# Patient Record
Sex: Male | Born: 1960 | Race: White | Hispanic: No | Marital: Married | Smoking: Former smoker
Health system: Southern US, Community
[De-identification: ages and names within clinical notes are randomized; demographics above are authoritative.]

## PROBLEM LIST (undated history)

## (undated) DIAGNOSIS — E782 Mixed hyperlipidemia: Secondary | ICD-10-CM

## (undated) DIAGNOSIS — K2270 Barrett's esophagus without dysplasia: Secondary | ICD-10-CM

## (undated) DIAGNOSIS — J449 Chronic obstructive pulmonary disease, unspecified: Secondary | ICD-10-CM

## (undated) DIAGNOSIS — M5010 Cervical disc disorder with radiculopathy, unspecified cervical region: Secondary | ICD-10-CM

## (undated) HISTORY — DX: Cervical disc disorder with radiculopathy, unspecified cervical region: M50.10

## (undated) HISTORY — DX: Mixed hyperlipidemia: E78.2

## (undated) HISTORY — DX: Barrett's esophagus without dysplasia: K22.70

## (undated) HISTORY — DX: Chronic obstructive pulmonary disease, unspecified: J44.9

## (undated) HISTORY — PX: BACK SURGERY: SHX140

## (undated) HISTORY — PX: TONSILLECTOMY: SUR1361

---

## 2014-12-14 ENCOUNTER — Emergency Department: Payer: BLUE CROSS/BLUE SHIELD

## 2014-12-14 ENCOUNTER — Encounter: Payer: Self-pay | Admitting: Emergency Medicine

## 2014-12-14 ENCOUNTER — Emergency Department
Admission: EM | Admit: 2014-12-14 | Discharge: 2014-12-14 | Disposition: A | Discharge status: Home / Self Care | Payer: BLUE CROSS/BLUE SHIELD | Attending: Emergency Medicine | Admitting: Emergency Medicine

## 2014-12-14 DIAGNOSIS — R109 Unspecified abdominal pain: Secondary | ICD-10-CM

## 2014-12-14 DIAGNOSIS — N200 Calculus of kidney: Secondary | ICD-10-CM | POA: Insufficient documentation

## 2014-12-14 DIAGNOSIS — Z880 Allergy status to penicillin: Secondary | ICD-10-CM | POA: Insufficient documentation

## 2014-12-14 DIAGNOSIS — R079 Chest pain, unspecified: Secondary | ICD-10-CM | POA: Diagnosis not present

## 2014-12-14 DIAGNOSIS — Z87891 Personal history of nicotine dependence: Secondary | ICD-10-CM | POA: Diagnosis not present

## 2014-12-14 HISTORY — DX: Chronic obstructive pulmonary disease, unspecified: J44.9

## 2014-12-14 LAB — CBC
HEMATOCRIT: 48.1 % (ref 40.0–52.0)
Hemoglobin: 15.8 g/dL (ref 13.0–18.0)
MCH: 30.4 pg (ref 26.0–34.0)
MCHC: 32.9 g/dL (ref 32.0–36.0)
MCV: 92.6 fL (ref 80.0–100.0)
Platelets: 340 10*3/uL (ref 150–440)
RBC: 5.19 MIL/uL (ref 4.40–5.90)
RDW: 14 % (ref 11.5–14.5)
WBC: 12.5 10*3/uL — ABNORMAL HIGH (ref 3.8–10.6)

## 2014-12-14 LAB — HEPATIC FUNCTION PANEL
ALT: 28 U/L (ref 17–63)
AST: 26 U/L (ref 15–41)
Albumin: 4.5 g/dL (ref 3.5–5.0)
Alkaline Phosphatase: 73 U/L (ref 38–126)
BILIRUBIN DIRECT: 0.2 mg/dL (ref 0.1–0.5)
BILIRUBIN INDIRECT: 0.3 mg/dL (ref 0.3–0.9)
Total Bilirubin: 0.5 mg/dL (ref 0.3–1.2)
Total Protein: 7.4 g/dL (ref 6.5–8.1)

## 2014-12-14 LAB — BASIC METABOLIC PANEL
Anion gap: 10 (ref 5–15)
BUN: 17 mg/dL (ref 6–20)
CALCIUM: 9.5 mg/dL (ref 8.9–10.3)
CO2: 27 mmol/L (ref 22–32)
Chloride: 103 mmol/L (ref 101–111)
Creatinine, Ser: 0.88 mg/dL (ref 0.61–1.24)
GFR calc Af Amer: >60 mL/min (ref >60)
GLUCOSE: 121 mg/dL — AB (ref 65–99)
Potassium: 3.7 mmol/L (ref 3.5–5.1)
Sodium: 140 mmol/L (ref 135–145)

## 2014-12-14 LAB — URINALYSIS COMPLETE WITH MICROSCOPIC (ARMC ONLY)
BILIRUBIN URINE: NEGATIVE
Bacteria, UA: NONE SEEN
GLUCOSE, UA: NEGATIVE mg/dL
Ketones, ur: NEGATIVE mg/dL
LEUKOCYTES UA: NEGATIVE
Nitrite: NEGATIVE
Protein, ur: NEGATIVE mg/dL
SQUAMOUS EPITHELIAL / LPF: NONE SEEN
Specific Gravity, Urine: 1.015 (ref 1.005–1.030)
pH: 7 (ref 5.0–8.0)

## 2014-12-14 LAB — LIPASE, BLOOD: LIPASE: 30 U/L (ref 11–51)

## 2014-12-14 LAB — TROPONIN I

## 2014-12-14 MED ORDER — SODIUM CHLORIDE 0.9 % IV BOLUS (SEPSIS)
500.0000 mL | Freq: Once | INTRAVENOUS | Status: AC
  Administered 2014-12-14: 500 mL via INTRAVENOUS

## 2014-12-14 MED ORDER — ONDANSETRON HCL 4 MG/2ML IJ SOLN
4.0000 mg | Freq: Once | INTRAMUSCULAR | Status: AC
  Administered 2014-12-14: 4 mg via INTRAVENOUS
  Filled 2014-12-14: qty 2

## 2014-12-14 MED ORDER — KETOROLAC TROMETHAMINE 30 MG/ML IJ SOLN
30.0000 mg | Freq: Once | INTRAMUSCULAR | Status: AC
  Administered 2014-12-14: 30 mg via INTRAVENOUS
  Filled 2014-12-14: qty 1

## 2014-12-14 MED ORDER — PROMETHAZINE HCL 25 MG/ML IJ SOLN
12.5000 mg | Freq: Once | INTRAMUSCULAR | Status: AC
Start: 2014-12-14 — End: 2014-12-14
  Administered 2014-12-14: 12.5 mg via INTRAVENOUS
  Filled 2014-12-14: qty 1

## 2014-12-14 MED ORDER — IBUPROFEN 200 MG PO TABS: 600.0000 mg | ORAL_TABLET | Freq: Four times a day (QID) | ORAL | Status: DC | PRN

## 2014-12-14 MED ORDER — HYDROMORPHONE HCL 1 MG/ML IJ SOLN
1.0000 mg | Freq: Once | INTRAMUSCULAR | Status: AC
  Administered 2014-12-14: 1 mg via INTRAVENOUS
  Filled 2014-12-14: qty 1

## 2014-12-14 MED ORDER — OXYCODONE-ACETAMINOPHEN 5-325 MG PO TABS: 1.0000 | ORAL_TABLET | Freq: Four times a day (QID) | ORAL | Status: DC | PRN

## 2014-12-14 MED ORDER — PROMETHAZINE HCL 25 MG RE SUPP: 25.0000 mg | Freq: Four times a day (QID) | RECTAL | Status: DC | PRN

## 2014-12-14 MED ORDER — TAMSULOSIN HCL 0.4 MG PO CAPS: 0.4000 mg | ORAL_CAPSULE | Freq: Every day | ORAL | Status: DC

## 2014-12-14 MED ORDER — KETOROLAC TROMETHAMINE 30 MG/ML IJ SOLN
15.0000 mg | Freq: Once | INTRAMUSCULAR | Status: AC
  Administered 2014-12-14: 15 mg via INTRAVENOUS
  Filled 2014-12-14: qty 1

## 2014-12-14 NOTE — ED Notes (Signed)
Patient transported to X-ray by x-ray staff

## 2014-12-14 NOTE — ED Notes (Signed)
Pt present with c/o right sided rib/chest pain started about <MEASUREMENT3Elna Breslow147#:[id number] BlueSouth Bend Specialty SuSherryllTh454098FAFord ON23GEX'B82ZOXWR'57UTresa 8Hyacinth MeekerTennyGille161021308502-865-432ZCaAdventist Medical CenWyatGMWNUC62 Loreta AveLZOXW714-602-05TiRUEKentuckyKentuck69621(775)34161[facility]1616564161Korea0Levora [id number] Se1610UVO9061311241Sparrow Carson HospitaSmith CountMarilyZO1610J782716201-348-48Vp Surgery Cente(404)531610[place]45409Lorin PickDe16109oRU16109Lanor281ZOXWR'U1161161097St Vincent D2728413442Korea BarbaraanJackquly13Rubye Beach10ZOXBergenpassaic Cataract Laser And Surge16106578[facility] GraWPella Regional Health Cen13 [id number] 93(30Elna Breslow147#:[id number] BlueConway Regional MeSherryllTh454098FASpring ON40GEX'B82ZOXWR'7UTresa 7Hyacinth M[phone] St. [facility] MWyatGMWNU16C7532 E.LoretZOXWR'([phone]621301-58161[facility], I161772161Korea0Levora 1Ezr17829104540Dalene Se1610UVO(385)[phone] Scott & White Medical Center At WaxahachiLa Amistad ResidentiMarilyZO1610J81616252-500-45Select Specialty Hospit646231610[place]45409Lorin PickDe16109oRU16109Lanor281ZOXWR'U11611644284156Korea7BarbaraanJackquly60Rubye Beach18ZOXGeorgia Bone And J16106578[facility] WBay Area Hospi15 C161944051RemMWN1610940981J 9851Elna Breslow147#:[id number] BlueDubuque EndoscoSherryllTh45409811FABraON61GEX'B82ZOXWR'68UTresa 3Hyacinth MeekerTennyGille161021308516-429-835ZPraCedar Park Regional MeWyatGMWNU161C7354 [address] SmokyLoreta AveZOX(667)[phone]621(985)06161Vibra Of Southeastern Michig16167141161Korea0Levora 1Ezr17829104540Dalene Se1610UVO(305)[phone] HospitaIndian River Medical Center-BehavMarilyZO161016641-455-68[facility](308391610[place]45409Lorin PickDe16109oRU16109Lanor281ZOXWR'U1161161097C2828412763Korea8BarbaraanJackquly24Rubye Beach56ZOXPiedmont Rock16106578Head And Neck Surgery Associates Psc Dba Center For SurWBeverly Hills Doctor Surgical Cen16 C1619644055RemMWN1610940981J 7266Elna Breslow147#:[id number] BlueBaldwin SherryllTh145409811FASouth WhiON71GEX'B82ZOXWR'67UTresa 6Hyacinth MeekerTennyGille161021308(225) 237-180ZWaPomereWyatGMWNU16C630 North High Loreta AveLZOXWR'U(720)059-90TiRUEKentuckyKentuck69621707-32161[facility]161(58771161Korea0Levora 1Ezr17829104540Dalene Se1610UVO8632226195Den[facility] LLSelect Specialty HosMarilyZO1610J3 16586-507-75[facility] For Spec973641610[place]45409Lorin PickDe16109oRU16109Lanor281ZOXWR'U1161161097Vaughan Regional Medical Cente76284199259Korea BarbaraanJackquly55Rubye Beach31ZOXBarstow Commu16106578Specialists In Urology Surgery WFaulkton Area Medical Cen14 C1619344051RemMWN1610940981J 9(4460Elna Breslow147#:[id number] BlueGrove City MeSherryllTh4540981FADonnelON24GEX'B82ZOXWR'9UTresa 3Hyacinth MeekerTennyGille161021308725-555-608ZHarWise Regional HWyatGMWNU1C8083 LorZO(937) 071-01TiRUEKentuckyKentuck69621854 85161Community Digestive Cent16194585161Korea0Levora 1Ezr17829104540Dalene Se1610UVO828-114-2934Kindred Hospital - PhiladeLPhiCoveMarilyZO161016304 370 98Comprehensive Surgery724221610[place]45409Lorin PickDe16109oRU16109Lanor281ZOXWR'U1161161097Pinnaclehealth H562841790Korea BarbaraanJackqulyRubye Beach34ZOXPhysicians Surgical Hospital - Pan16106578San Francisco SurgeryWWayne Medical Cen16 C1619344045RemMWN1610940981J 66(336Elna Breslow147#:[id number] BlueLiberty MeSherryllTh145409811FANiaON42GEX'B82ZOXWR'1UTresa 8Hyacinth MeekerTennyGille16102130833014752WyatGMWNC8391 Loreta AveLZOXWR'U7(463)120-01TiRUEKentuckyKentuck69621204-28161[facility] Hospit161686556161Korea0Levora 1Ezr17829104540Dalene Se1610UVO212-304-12689Th Medical GrouVa Medical CenterMarilyZO1610J9059 A16740 007 23Central Sta(504)571610[place]45409Lorin PickDe16109oRU16109Lanor281ZOXWR'U1161161097[facility] -62284148827 KoreaEBarbaraanJackquly17Rubye Beach93ZOXAnn & Robert H Lurie Children'S Hospit16106578[facility], Inc. AndWNorth Shore Same Day Surgery Dba North Shore Surgical Cen13 C1619344044RemMWN1610940981J 3(6094Elna Breslow147#:[id number] BlueSelect Specialty Hospital Central PennsSherryllTh4540981FAArkansas ON62GEX'B82ZOXWR'8UTresa 5Hyacinth MeekerTennyGille161021308([phone] 792-980ZWinfStory CityWyatGMWNU1611C225 RockLoreta AveL7819 SZOXWR'U7620301-101-44TiRUEKentuckyKentuck69621231-17161[facility]al16143023161Korea0Levora 1Ezr17829104540Dalene Se1610UVO534-546-2266New Vision Surgical CenteMarilyZO1610J695 G16352-227-39United Hosp817621610[place]45409Lorin PickDe16109oRU16109Lanor281ZOXWR'U1161161097Poplar Bluff Regional Medica562841Korea5BarbaraanJackquly84Rubye Beach35ZOXPrisma He16106578Evanston RegionaWSartori Memorial Hospi16 C1619544018RemMWN1610940981J (84Elna Breslow147#:[id number] BlueAdventist Health St. HelSherryllTh454098119FANorth HON22GEX'B82ZOXWR'23UTresa 5Hyacinth MeekerTennyGille161021308437-229-881ZOnSt BeWyatGMWNU161119Crest VC732 LoretZ647-810-54TiRUEKentuckyKentuck69621719-06161[facility] L161563123161Korea0Levora 1Ezr17829104540Dalene Se1610UVO667-777-7876Sutter Roseville Medical CenteSaint Clares HospMarilyZO1610J90 [phone] 359-14[facility] Med661761610[place]45409Lorin PickDe16109oRU16109Lanor281ZOXWR'U1161161097Tria Orthopaedic48284128Korea1BarbaraanJackquly24Rubye Beach74ZOXRochelle Commu16106578Ev[facility] Surgery Center GateWAcuity Specialty Hospital Of New Jer18 C1619744028RemMWN1610940981J 0(5080Elna Breslow147#:[id number] BlueDesoto SuSherryllTh454FAMyON4GEX'B82ZOXWR'16UTresa 3Hyacinth MeekerTennyGille161021308([phone]U1611C238 LexiLoreta AvZOXWR'U7743-340-81TiRUEKentuckyKentuck69621670-60161[facility] L161(903863161Korea0Levora 1Ezr17829104540Dalene Se1610UVO418-221-5575Physicians Medical CenteWartMarilyZO1610J416412 049 28O[facility] Of305871610[place]45409Lorin PickDe16109oRU16109Lanor281ZOXWR'U1161161097Samuel Mahelona M98284143Korea2BarbaraanJackquly53Rubye Beach69ZOXIndiana Regional M16106578Baylor Institute For RehabilitationWPediatric Surgery Centers 13 C1619644067RemMWN1610940981J 6351Elna Breslow147#:[id number] BlueChildreSherryllTh45409FAPiper ON47GEX'B82ZOXWR'77UTresa 4Hyacinth MeekerTennyGille161021308587-843-178ZAlta SiJohns Hopkins Surgery Centers Series Dba White Marsh Surgery WyatGMWNU161119RC7749 BaLoreta AveL346 EZO705-639-56TiRUEKentuckyKentuck69621630-14161G[facility] Cent16186224161Korea0Levora 1Ezr17829104540Dalene Se1610UVO504-853-7754Emerald Surgical Center LLDesert SunMarilyZO1610J962 16445-561-64The Outer Ban(941)751610[place]45409Lorin PickDe16109oRU16109Lanor281ZOXWR'U1161161097Merc63284149Korea0BarbaraanJackquly49Rubye Beach16ZOXWestern Nevada Surgic16106578Coastal Endoscopy WEastern State Hospi14 C161944079RemMWN1610940981J 1250Elna Breslow147#:[id number] BlueHarsha BehavioraSherryllTh4540981FAMonON55GEX'B82ZOXWR'15UTresa 6Hyacinth MeekerTennyGille161021308(445)060-950ZLakWyatGMWNC90 LawrLoreta AveL9ZOXWR'U485 424 389 35TiRUEKentuckyKentuck69621534-16161The [facility]patient [facility] Of Delr161781168161Korea0Levora 1Ezr17829104540Dalene Se1610UVO([phone] Surgical Center LLGreater ErieMarilyZO161016817-613-41[facility] And Med615511610[place]45409Lorin PickDe16109oRU16109Lanor281ZOXWR'U1161161097Woodhams Laser And Lens Im83284138338 KoreaBBarbaraanJackquly21Rubye Beach8ZOXRepublic Co16106578Nch Healthcare System North Naples HospiWSt Anthonys Memorial Hospi13 C1619544066RemMWN1610940981J 80Elna Breslow147#:[id number] BlueKessler Institute For ReSherryllTh4540FAHillcON27GEX'B82ZOXWR'56UTresa 9Hyacinth MeekerTennyGille161021308660-708-197ZVenFirst SWyatGMWNU161119C193 ALoreta AveZOXWR'U554 (602) 328-75TiRUEKentuckyKentuck69621(802) 14161[facility]16156719161Korea0Levora 1Ezr17829104540Dalene Se1610UVO806-756-5522Johnson City Eye Surgery CenteFrances MahonMarilyZO1610J4116858-773-49Lassen Sur(915201610[place]45409Lorin PickDe16109oRU16109Lanor281ZOXWR'U1161161097Mercy Hospita728416764Korea BarbaraanJackquly98Rubye Beach39ZOXTristar Ashland City M16106578Shasta Eye SuWRinggold County Hospi13 C161944011RemMWN1610940981J 91Elna Breslow147#:[id number] BlueSt. VincSherryllTh45FAHeON9GEX'B82ZOXWR'23UTresa 6Hyacinth MeekerTennyGille161021308(859)502-506ZPresRegency Hospital Of CWyatGMWNU16111C924C N. Loreta AveZOXWR775-741-28TiRUEKentuckyKentuck69621613-42161[facility]161(57276161Korea0Levora 1Ezr17829104540Dalene Se1610UVO712-123-7167North Coast Endoscopy InMemphis Veterans AffMarilyZO1610J760 Gl16949-009-75Day Kimba(858)441610[place]45409Lorin PickDe16109oRU16109Lanor281ZOXWR'U1161161097Wellstar Sylva16284128741 KoreaNBarbaraanJackquly96Rubye Beach51ZOXVictor Valley Global M16106578Sand Lake SurgiWMendota Community Hospi14 C161964407RemMWN1610940981J 38(58Elna Breslow147#:[id number] BlueJesse Brown Va Medical Center - Va Chicago HealtSherryllTh45409FAShishmON42GEX'B82ZOXWR'67UTresa Hyacinth MeekerTennyGille161021308([phone] GBarstow CommWyatGMWNU161119C682Loreta AvZOXW(902) 030-54TiRUEKentuckyKentuck69621534-15161[facility] [place] 161(505697161Korea0Levora 1Ezr17829104540Dalene Se1610UVO574 736 4831Phoenix Children'S HospitaPinevilleMarilyZO1610J41 16([phone] De93311610[place]45409Lorin PickDe16109oRU16109Lanor281ZOXWR'U1161161097Copper Hi5284181Korea BarbaraanJackquly4Rubye Beach53ZOXDay Op Center Of Lo16106578Midwest Eye SurgWHenry Ford Hospi16 C1619344031RemMWN1610940981J 3(387Elna Breslow147#:[id number] BlueComprehensive OutpSherryllTh4540FALinvON45GEX'B82ZOXWR'44UTresa 3Hyacinth MeekerTennyGille161021308([phone] FayettevWyatGMWNU16111C757 ILoreta ZOXW302-407-43TiRUEKentuckyKentuck69621443-20161[facility] Cent161(823365161Korea0Levora 1Ezr17829104540Dalene Se1610UVO501 228 837Sil[facility] Surgery Center LSouth Arlington Surgica Providers Inc DbaMarilyZO1610J70116856 244 78Kind[facility] - Fort Wort94671610[place]45409Lorin PickDe16109oRU16109Lanor281ZOXWR'U1161161097Banner-University Medical Cent5328Korea4BarbaraanJackquly9Rubye Beach76ZOXMark Fromer LLC Dba Eye Surgery Center16106578Avera CreightoWSheridan Surgical Center 14 C1619544047RemMWN1610940981J 1497Elna Breslow147#:[id number] BlueColorado Acute Long TSherryllTh454098FABlue Clay FON60GEX'B82ZOXWR'10UTresa 4Hyacinth MeekerTennyGille161021308316-272-680ZMashantuRochester GWyatGMWNU161C485 HLoreta AveZOXWR'U53 No97303599TiRUEKentuckyKentuck696215735161Providence Hospital Northea161065161Korea0Levora 1Ezr17829104540Dalene Se1610UVO([phone] PMarilyZO1610J16([phone] Community Memori717531610[place]45409Lorin PickDe16109oRU16109Lanor281ZOXWR'U1161161097Olympi282841Korea1BarbaraanJackquly5Rubye Beach75ZOXAnaheim Global M16106578Seattle Hand SurgerWBarnet Dulaney Perkins Eye Center Safford Surgery Cen14 C1619144020RemMWN1610940981JXWya257 But893161191UVO4080260148161161Bay Park Communi972161045409Lorin Pick098Lanora1917822WU6369J119Samaritan Lebanon Community Hospita1751J 1(9491Elna Breslow147#:[id number] BlueBlue Springs SuSherryllTh4540FACedar HON40GEX'B82ZOXWR'73UTresa 5Hyacinth MeekerTennyGille161021308773-400-227ZPalm SprBaptist Memorial Hospital - GoldWyatGMWNC8686 LiLoreta AveZOXWR'U969 74356207TiRUEKentuckyKentuck69621971-07161Van Bu[facility] Hospit16151455161Korea0Levora 1Ezr17829104540Dalene Se1610UVO531 183 11102Jerold PheLPs Community HospitaPalmsMarilyZO1610J1716(707) 379-61Franklin Endoscopy55371610[place]45409Lorin PickDe16109oRU16109Lanor281ZOXWR'U116116109Roosevelt 7828413742 S.[place] BarbaraanJackquly77Rubye Beach32ZOXCumberland Valley S16106578Gastrointestinal Associates Endoscopy WCoastal Bend Ambulatory Surgical Cen13 C1619544075RemMWN1610940981J 44(774Elna Breslow147#:[id number] BlueCypress Pointe SurgiSherryllTFAMON51GEX'B82ZOXWR'50UTresa 5Hyacinth MeekerTennyGille161021308581123863ZGreenfRegency HospitWyatGMWNU161119C543 South NLoreta AZOXWR205-252-62TiRUEKentuckyKentuck69621709-19161[facility] L16126527161Korea0Levora 1Ezr17829104540Dalene Se1610UVO830-404-5978Summit Medical CenteMarionMarilyZO1610J316269-713-69Southwest Washington Regional Surgery224721610[place]45409Lorin PickDe16109oRU16109Lanor281ZOXWR'U1161161097Doctors [facility] S21284146 KoreaPBarbaraanJackquly87Rubye Beach32ZOXRogers City Rehabilita16106578Claiborne CountWTops Surgical Specialty Hospi11 C1619344088RemMWN1610940981 42(651Elna Breslow147#:[id number] BluePutnam HosSherryllTh45409FAMouON79GEX'B82ZOXWR'31UTresa 5Hyacinth MeekerTennyGille161021308937-453-982ZLNationwide ChilWyatGMWNLoreZOXWR410-513-43TiRUEKentuckyKentuck696219495161Men[facility] L161(5707944161Korea0Levora 1Ezr17829104540Dalene Se1610UVO320-635-8983Hamilton County HospitaLaredo RehaMarilyZO1610J414 Ga16(986)158-68[facility] 33821610[place]45409Lorin PickDe16109oRU16109Lanor281ZOXWR'U1161161097Riverside B232841Korea4BarbaraanJackquly34Rubye Beach84ZOXEncompass Health Nittany Valley Rehabilita16106578Orthopedic Specialty HospitalWEndoscopy Center Of [place] Norman 14 C1619444052RemMWN1610940981J 54Elna Breslow147#:[id number] BlueAmbulatory Endoscopy CenterSherryllTh45409FAMashantuON77GEX'B82ZOXWR'28UTresa 2Hyacinth MeekerTennyGille161021308905-884-383ZSt. MSt JoWyatGMWNU161C47 LakesLoreta AveL49903-340-50TiRUEKentuckyKentuck69621(775) 29161[facility]161(853962161Korea0Levora 1Ezr17829104540Dalene Se1610UVO(415)501-684Timpanogos Regional HospitaMt Sinai HospMarilyZO161016(917)205-50Ka[facility] Mental [facility] Hospi(973731610[place]45409Lorin PickDe16109oRU16109Lanor281ZOXWR'U1161161097Arbour108284177236 KoreaBBarbaraanJackquly26Rubye Beach72ZOXProcedure Cen16106578Walthall County GeneraWMountain View Surgical Center 16 C1619344044RemMWN1610940981J 26(75Elna Breslow147#:[id number] BlueHighSherryllTh4540FAArchON69GEX'B82ZOXWR'76UTresa 8Hyacinth MeekerTennyGille161021308325-117-756ZLiHospital District No 6 Of Harper County, Ks Dba Patterson WyatGMWNU1C921 LakeLoreta AveZOXWR'(609) 169-49TiRUEKentuckyKentuck69621512-06161[facility] Ophthalmologists LLC Dba [place] Ophthalmologists Ambulatory Surgery Cent16154747161Korea0Levora 1Ezr17829104540Dalene Se1610UVO380-208-9137Huggins HospitaUniversity Of WashinMarilyZO1610J5 G16302-581-93Feliciana Forens(937691610[place]45409Lorin PickDe16109oRU16109Lanor281ZOXWR'U1161161097Wake Endo6628415984Korea BarbaraanJackquly34Rubye Beach25ZOXFairfax Commu16106578Harrison CommunitWMission Endoscopy Center 13 C1619844019RemMWN1610940981J 57(31Elna Breslow147#:[id number] BlueDakota Plains SurSherryllTh4540FAHornON66GEX'B82ZOXWR'63UTresa 6Hyacinth MeekerTennyGille161021308780-726-823ZIslanSurgiWyatGMWNU1C405 North GrLoreta AveL56 ZOXWR'U379 V(224)576-29TiRUEKentuckyKentuck69621(504)58161[facility] L161(22667161Korea0Levora 1Ezr17829104540Dalene Se1610UVO360-714-5768Sanford BismarcJupMarilyZO1610J464 South [place]aver16618-347-12I(307901610[place]45409Lorin PickDe16109oRU16109Lanor281ZOXWR'U1161161097Chicot Memoria41284Korea1BarbaraanJackquly37Rubye Beach36ZOXNorman End16106578Oil Center SurgWKatherine Shaw Bethea Hospi17 C1619544018RemMWN1610940981J 6948Elna Breslow147#:[id number] BlueParamus Endoscopy LLC Dba Endoscopy Center Of BSherryllTh45409811FAValleON30GEX'B82ZOXWR'50UTresa 4Hyacinth MeekerTennyGille161021308220-587-317ZPulTheWyatGMWNU161119GC8 Old GLoreta AvZOXW98954432TiRUEKentuckyKentuck696215063161[facility] Hospit161423068161Korea0Levora 1Ezr17829104540Dalene Se1610UVO516-499-916Doctors HospitaWhite RMarilyZO161016320-033-56Uh Health Shands Reh30241610[place]45409Lorin PickDe16109oRU16109Lanor281ZOXWR'U1161161097The Surgicar7628416457Korea BarbaraanJackquly70Rubye Beach63ZOXHouston Methodist Bay16106578TrinityWQuad City Ambulatory Surgery Center 17 C1619144079RemMWN1610940981 85(25Elna Breslow147#:[id number] BlueG.V. (Sonny) Montgomery Va MeSherryllTh4540981FALockinON39GEX'B82ZOXWR'27UTresa 8Hyacinth MeekerTennyGille161021308(551)763-281ZFranFranklin SurgWyatGMWNU161C95 HLoreta AveL87 ZOXWR'865-185-79TiRUEKentuckyKentuck696213376161Morehouse General Hospit161302282161Korea0Levora 1Ezr17829104540Dalene Se1610UVO6607142528Surgery Center Of Long BeacNovant Hospital Charlotte MarilyZO1610J83816713-395-14Louis A. Johnson Va Med352281610[place]45409Lorin PickDe16109oRU16109Lanor281ZOXWR'U1161161097Regional S8328412Korea3BarbaraanJackquly18Rubye Beach10ZOXSurgcenter Of Sout16106578Fisher-TituWMadison County Memorial Hospi17 C1619444067RemMWN1610940981J 9536Elna Breslow147#:[id number] BlueMccaSherryllTh454098119FAWesON92GEX'B82ZOXWR'27UTresa 9Hyacinth MeekerTennyGille161021308508-367-662ZHennSt. Joseph MWyatGMWNU161119St. C9407 Loreta ZOXWR'U4(318) 162-35TiRUEKentuckyKentuck69621660-75161As[facility]161(975688161Korea0Levora 1Ezr17829104540Dalene Se1610UVO(606)573-652988Th Medical Group - Wright-[employer] Force Base Medical CenteChristian Hospital MarilyZO1610J260 Midd16787-296-91Citrus Urology437531610[place]45409Lorin PickDe16109oRU16109Lanor281ZOXWR'U1161161097St [facility] 1002841382Korea0BarbaraanJackquly76Rubye Beach78ZOXAscension Seton South16106578Brandon Ambulatory Surgery Center Lc Dba Brandon Ambulatory SurgWAffiliated Endoscopy Services Of Clif18 C1619344055RemMWN1610940981J 7(80Elna Breslow147#:[id number] BlueJewish HospitalSherryllTh45409FAFrostpON10GEX'B82ZOXWR'29UTresa 7Hyacinth MeekerTennyGille161021308(251) 538-410ZRocky PAnmed Health Cannon MemWyatGMWNU161119MC749Loreta AveLZOX585-086-90TiRUEKentuckyKentuck69621(253)32161Tem[facility] Surgery Cent161827071161Korea0Levora 1Ezr17829104540Dalene Se1610UVO([phone] Va Medical CenteAtlanticare Regional Medical Center MarilyZO1610J8711 NE. Bee16903-195-14Resurgens Fayette Surgery3121610[place]45409Lorin PickDe16109oRU16109Lanor281ZOXWR'U1161161097Trinity Surgery Center LLC Dba Baycar62284158Korea8BarbaraanJackquly28Rubye Beach2ZOXVance Thompson Vision Surgery Center16106578Grinnell GeneraWEl Paso Center For Gastrointestinal Endoscopy 18 C1619644091RemMWN1610940981J 2561Elna Breslow147#:[id number] BlueThibodaux ESherryllTh454FAWalnut CON6GEX'B82ZOXWR'62UTresa 6Hyacinth MeekerTennyGille161021308551-130-764ZNew HamAvera FlaWyatGMWNU16111C605 E. RockLoreta AveL971ZOXWR732-770-30TiRUEKentuckyKentuck69621(405)42161Paragon Laser And Eye Surgery Cent16205161Korea0Levora 1Ezr17829104540Dalene Se1610UVO(785)216-5022[facility] Orthopaedic Group LLThomasvMarilyZO1610J7183 Me16(617) 187-89Medical Plaza Ambulatory Surgery Center As305321610[place]45409Lorin PickDe16109oRU16109Lanor281ZOXWR'U1161161097Christus St. Micha66284Korea1BarbaraanJackquly15Rubye Beach82ZOXSilver Hill H16106578Va [facility] - HWPerry Community Hospi12 C1619644035RemMWN1610940981J 7(793Elna Breslow147#:[id number] BlueLakeland HospitaSherryllTh45409811FAHoON10GEX'B82ZOXWR'82UTresa 5Hyacinth MeekerTennyGille161021308(806)163-557ZOljato-Monument VaEl Mirador Surgery Center LLC Dba El Mirador SuWyatGMWNU16111C90Loreta AveL9ZO602-841-85TiRUEKentuckyKentuck69621671-33161T[facility] Orthopaedic Center L161201723161Korea0Levora 1Ezr17829104540Dalene Se1610UVO301-736-786[facility] And Madison CountMountain LMarilyZO1610J16573-220-21[facility] & 407531610[place]45409Lorin PickDe16109oRU16109Lanor281ZOXWR'U116116109Visi4228419311 MKoreaeBarbaraanJackquly71Rubye Beach59ZOXHill16106578W[facility] Surgery [facility] Limited Partnership Dba Wauwatosa SurgWMusc Health Florence Medical Cen13 C1619344090RemMWN1610940981JX23B:147829562sansandientific657182Lajo1614616100861(225)352-037572De7316109 

## 2014-12-14 NOTE — ED Provider Notes (Addendum)
[facility] Emergency Department Provider Note  ____________________________________________   I have reviewed the triage vital signs and the nursing notes.   HISTORY  Chief Complaint Chest Pain and Abdominal Pain    HPI Michael Christian is a 54 y.o. male with a sudden onset right sided flank pain for partially 20 mg premature arrival. 10 out of 10. At maximal intensity at onset. No antecedent chest pain or shortness of breath. Pain is in the flank going towards his groin. He has had no trauma. Denies hematuria or dysuria or fever. Has had vomiting and nausea. Denies history of kidney stones. He has not had any other surgeries on his abdomen aside from back surgery with a need to go through his abdomen to obtain.  Past Medical History  Diagnosis Date  . COPD (chronic obstructive pulmonary disease) (HCC)     There are no active problems to display for this patient.   Past Surgical History  Procedure Laterality Date  . Back surgery      No current outpatient prescriptions on file.  Allergies Penicillins  No family history on file.  Social History Social History  Substance Use Topics  . Smoking status: Former Games developermoker  . Smokeless tobacco: None  . Alcohol Use: No    Review of Systems Constitutional: No fever/chills Eyes: No visual changes. ENT: No sore throat. No stiff neck no neck pain Cardiovascular: Denies chest pain. Respiratory: Denies shortness of breath. Gastrointestinal:   Positive vomiting.  No diarrhea.  No constipation. Genitourinary: Negative for dysuria. Musculoskeletal: Negative lower extremity swelling Skin: Negative for rash. Neurological: Negative for headaches, focal weakness or numbness. 10-point ROS otherwise negative.  ____________________________________________   PHYSICAL EXAM:  VITAL SIGNS: ED Triage Vitals  Enc Vitals Group     BP 12/14/14 1509 159/95 mmHg     Pulse Rate 12/14/14 1509 66     Resp 12/14/14  1509 17     Temp 12/14/14 1440 97.8 F (36.6 C)     Temp src --      SpO2 12/14/14 1509 100 %     Weight 12/14/14 1355 235 lb (106.595 kg)     Height 12/14/14 1355 6' (1.829 m)     Head Cir --      Peak Flow --      Pain Score 12/14/14 1354 10     Pain Loc --      Pain Edu? --      Excl. in GC? --     Constitutional: Alert and oriented. Well appearing and appears uncomfortable but not toxic Eyes: Conjunctivae are normal. PERRL. EOMI. Head: Atraumatic. Nose: No congestion/rhinnorhea. Mouth/Throat: Mucous membranes are moist.  Oropharynx non-erythematous. Neck: No stridor.   Nontender with no meningismus Cardiovascular: Normal rate, regular rhythm. Grossly normal heart sounds.  Good peripheral circulation. Respiratory: Normal respiratory effort.  No retractions. Lungs CTAB. Abdominal: Soft and nontender. No distention. No guarding no rebound Back:  There is no focal tenderness or step off there is no midline tenderness there are no lesions noted. there is right CVA tenderness Musculoskeletal: No lower extremity tenderness. No joint effusions, no DVT signs strong distal pulses no edema Neurologic:  Normal speech and language. No gross focal neurologic deficits are appreciated.  Skin:  Skin is warm, dry and intact. No rash noted. Psychiatric: Mood and affect are normal. Speech and behavior are normal.  ____________________________________________   LABS (all labs ordered are listed, but only abnormal results are displayed)  Labs Reviewed  BASIC  METABOLIC PANEL - Abnormal; Notable for the following:    Glucose, Bld 121 (*)    All other components within normal limits  CBC - Abnormal; Notable for the following:    WBC 12.5 (*)    All other components within normal limits  TROPONIN I  LIPASE, BLOOD  HEPATIC FUNCTION PANEL  URINALYSIS COMPLETEWITH MICROSCOPIC ([facility] ONLY)   ____________________________________________  EKG  I personally interpreted any EKGs ordered by me or  triage Some degree of artifact limits interpretation right bundle branch block with no acute ischemic changes noted rate 70 bpm for comparison ____________________________________________  RADIOLOGY  I reviewed any imaging ordered by me or triage that were performed during my shift ____________________________________________   PROCEDURES  Procedure(s) performed: None  Critical Care performed: None  ____________________________________________   INITIAL IMPRESSION / ASSESSMENT AND PLAN / ED COURSE  Pertinent labs & imaging results that were available during my care of the patient were reviewed by me and considered in my medical decision making (see chart for details).  Patient with signs and symptoms of a kidney stone initially with sudden onset right flank pain AAA is also on the differential as is dissection however, kidney stones in the most likely therefore I did a CT scan were using no contrast to evaluate and sure enough the patient has a 2 mm kidney stone. Patient now given that I do not see evidence of dissection is a candidate for Toradol we'll give him which I think will greatly improve his discomfort. Dilaudid certainly help but it is wearing off and this is a better medication. Patient is made aware of all the findings including a small lung nodule and questionable 2 cm hepatic mass which will require outpatient follow-up and I have instructed him of the need to do so. Patient voices understanding. We get his pain under control we'll discharge with follow-up with urology as well as PCP for further evaluation of these incidentally noted processes   ----------------------------------------- 5:13 PM on 12/14/2014 -----------------------------------------  As hoped and anticipated patient is pain-free and a symptomatic after Toradol. We are awaiting urine sample we'll send him home after that ____________________________________________   FINAL CLINICAL IMPRESSION(S) / ED  DIAGNOSES  Final diagnoses:  Right flank pain     Jeanmarie Plant, MD 12/14/14 1635  Jeanmarie Plant, MD 12/14/14 [phone]

## 2014-12-14 NOTE — Discharge Instructions (Signed)
° °  you have a small kidney stone that should pass.On the CT scan we also noted some incidental findings and it is unclear their significance. This is not unusual but you do need to follow-up with your primary care doctor. For this reason, we are placing below the findings from the CT scan. There is possible abnormal findings in both your liver and lungs. I do not advise you get overly concerned about this but I do strongly recommend that you follow-up for outpatient reimaging as noted below to make sure there is nothing serious.     IMPRESSION: 1. 2 mm right UVJ calculus resulting in mild right hydroureteronephrosis. 2. 3 mm left lower lobe pulmonary nodule. If the patient is at high risk for bronchogenic carcinoma, follow-up chest CT at 1 year is recommended. If the patient is at low risk, no follow-up is needed. This recommendation follows the consensus statement: Guidelines for Management of Small Pulmonary Nodules Detected on CT Scans: A Statement from the Fleischner Society as published in Radiology 2005; 237:395-400. 3. Ill defined 2 cm hypodense right hepatic mass near the porta hepatis of uncertain etiology. Recommend further evaluation with NON EMERGENT CT or MRI of the abdomen without and with intravenous contrast.

## 2016-04-24 ENCOUNTER — Ambulatory Visit
Admission: RE | Admit: 2016-04-24 | Discharge: 2016-04-24 | Disposition: A | Discharge status: Home / Self Care | Payer: BLUE CROSS/BLUE SHIELD | Source: Ambulatory Visit | Attending: Nurse Practitioner | Admitting: Nurse Practitioner

## 2016-04-24 ENCOUNTER — Other Ambulatory Visit: Payer: Self-pay | Admitting: Nurse Practitioner

## 2016-04-24 DIAGNOSIS — R531 Weakness: Secondary | ICD-10-CM

## 2016-04-24 DIAGNOSIS — M5010 Cervical disc disorder with radiculopathy, unspecified cervical region: Secondary | ICD-10-CM | POA: Diagnosis not present

## 2016-04-24 DIAGNOSIS — J449 Chronic obstructive pulmonary disease, unspecified: Secondary | ICD-10-CM | POA: Insufficient documentation

## 2016-04-24 DIAGNOSIS — R05 Cough: Secondary | ICD-10-CM

## 2016-04-24 DIAGNOSIS — R059 Cough, unspecified: Secondary | ICD-10-CM

## 2016-04-24 DIAGNOSIS — M5382 Other specified dorsopathies, cervical region: Secondary | ICD-10-CM | POA: Diagnosis present

## 2017-01-31 ENCOUNTER — Encounter: Payer: Self-pay | Admitting: Nurse Practitioner

## 2017-01-31 ENCOUNTER — Ambulatory Visit: Payer: 59 | Admitting: Nurse Practitioner

## 2017-01-31 VITALS — BP 129/79 | HR 83 | Temp 97.5°F | Resp 16 | Ht 72.0 in | Wt 229.4 lb

## 2017-01-31 DIAGNOSIS — M5010 Cervical disc disorder with radiculopathy, unspecified cervical region: Secondary | ICD-10-CM | POA: Insufficient documentation

## 2017-01-31 DIAGNOSIS — B354 Tinea corporis: Secondary | ICD-10-CM | POA: Diagnosis not present

## 2017-01-31 DIAGNOSIS — J441 Chronic obstructive pulmonary disease with (acute) exacerbation: Secondary | ICD-10-CM

## 2017-01-31 DIAGNOSIS — J439 Emphysema, unspecified: Secondary | ICD-10-CM | POA: Insufficient documentation

## 2017-01-31 DIAGNOSIS — E782 Mixed hyperlipidemia: Secondary | ICD-10-CM | POA: Diagnosis not present

## 2017-01-31 DIAGNOSIS — J0140 Acute pansinusitis, unspecified: Secondary | ICD-10-CM

## 2017-01-31 DIAGNOSIS — K2270 Barrett's esophagus without dysplasia: Secondary | ICD-10-CM

## 2017-01-31 DIAGNOSIS — E785 Hyperlipidemia, unspecified: Secondary | ICD-10-CM | POA: Insufficient documentation

## 2017-01-31 MED ORDER — ATORVASTATIN CALCIUM 20 MG PO TABS: 20.0000 mg | ORAL_TABLET | Freq: Every day | ORAL | 3 refills | Status: DC

## 2017-01-31 MED ORDER — ALBUTEROL SULFATE HFA 108 (90 BASE) MCG/ACT IN AERS: 2.0000 | INHALATION_SPRAY | Freq: Four times a day (QID) | RESPIRATORY_TRACT | 3 refills | Status: DC | PRN

## 2017-01-31 MED ORDER — CLOTRIMAZOLE-BETAMETHASONE 1-0.05 % EX CREA: 1.0000 "application " | TOPICAL_CREAM | Freq: Two times a day (BID) | CUTANEOUS | 0 refills | Status: DC

## 2017-01-31 MED ORDER — BUDESONIDE-FORMOTEROL FUMARATE 160-4.5 MCG/ACT IN AERO: 2.0000 | INHALATION_SPRAY | Freq: Two times a day (BID) | RESPIRATORY_TRACT | 3 refills | Status: DC

## 2017-01-31 MED ORDER — OMEPRAZOLE 20 MG PO CPDR: 20.0000 mg | DELAYED_RELEASE_CAPSULE | Freq: Every day | ORAL | 3 refills | Status: DC

## 2017-01-31 MED ORDER — SULFAMETHOXAZOLE-TRIMETHOPRIM 800-160 MG PO TABS: 1.0000 | ORAL_TABLET | Freq: Two times a day (BID) | ORAL | 0 refills | Status: DC

## 2017-01-31 NOTE — Progress Notes (Signed)
[facility] [address] [place], [place] [address]  Internal MEDICINE  Office Visit Note  Patient Name: Michael Christian  [id number]  [id number]  Date of Service: 01/31/2017  Chief Complaint  Patient presents with  . Follow-up    medication refills  . Rash    The patient is here for routine follow up visit. States that he lost his insurance in September, just recently got it back. Is out of all his maintenance medications.  Would like to switch maintenance COPD medication to something other than Breo. Not really helping to control episodes of flare.  Feels congested in chest. Has increased shortness of breath with exertion. Has chest tightness. Has some intestinal cramping.     Pt is here for routine follow up.    Current Medication: Outpatient Encounter Medications as of 01/31/2017  Medication Sig  . ibuprofen (MOTRIN IB) 200 MG tablet Take 3 tablets (600 mg total) by mouth every 6 (six) hours as needed.  Marland Kitchen oxyCODONE-acetaminophen (ROXICET) 5-325 MG tablet Take 1 tablet by mouth every 6 (six) hours as needed.  . promethazine (PHENERGAN) 25 MG suppository Place 1 suppository (25 mg total) rectally every 6 (six) hours as needed for nausea.  . tamsulosin (FLOMAX) 0.4 MG CAPS capsule Take 1 capsule (0.4 mg total) by mouth daily.   No facility-administered encounter medications on file as of 01/31/2017.     Surgical History: Past Surgical History:  Procedure Laterality Date  . BACK SURGERY    . TONSILLECTOMY      Medical History: Past Medical History:  Diagnosis Date  . Barrett's esophagus without dysplasia   . Cervical disc disorder with radiculopathy, unspecified cervical region   . Chronic obstructive pulmonary disease (COPD) (HCC)   . COPD (chronic obstructive pulmonary disease) (HCC)   . Mixed hyperlipidemia     Family History: Family History  Problem Relation Age of Onset  . Alzheimer's disease Father   . Lung cancer Brother   . Emphysema Brother      Social History   Socioeconomic History  . Marital status: Married    Spouse name: Not on file  . Number of children: Not on file  . Years of education: Not on file  . Highest education level: Not on file  Social Needs  . Financial resource strain: Not on file  . Food insecurity - worry: Not on file  . Food insecurity - inability: Not on file  . Transportation needs - medical: Not on file  . Transportation needs - non-medical: Not on file  Occupational History  . Not on file  Tobacco Use  . Smoking status: Former Games developer  . Smokeless tobacco: Never Used  Substance and Sexual Activity  . Alcohol use: No  . Drug use: No  . Sexual activity: Not on file  Other Topics Concern  . Not on file  Social History Narrative  . Not on file      Review of Systems  Constitutional: Negative for chills, fatigue and unexpected weight change.  HENT: Positive for congestion, postnasal drip, sinus pressure and sore throat. Negative for rhinorrhea and sneezing.   Eyes: Negative for redness.  Respiratory: Negative for cough, chest tightness and shortness of breath.   Cardiovascular: Negative for chest pain and palpitations.  Gastrointestinal: Negative for abdominal pain, constipation, diarrhea, nausea and vomiting.       Intermittent abdominal cramping.   Genitourinary: Negative for dysuria and frequency.  Musculoskeletal: Negative for arthralgias, back pain, joint swelling and neck pain.  Skin: Positive for rash.       Circular shaped rash in bend of his right arm. Started out small and has gradually gotten bigger. Has a second patch of rash on left sid of neck and chest.   Allergic/Immunologic: Positive for environmental allergies.  Neurological: Positive for headaches. Negative for tremors and numbness.  Hematological: Negative for adenopathy. Does not bruise/bleed easily.  Psychiatric/Behavioral: Negative for behavioral problems (Depression), sleep disturbance and suicidal ideas. The  patient is not nervous/anxious.     Today's Vitals   01/31/17 0958  BP: 129/79  Pulse: 83  Resp: 16  Temp: (!) 97.5 F (36.4 C)  TempSrc: Oral  SpO2: 96%  Weight: 229 lb 6.4 oz (104.1 kg)  Height: 6' (1.829 m)    Physical Exam  Constitutional: He is oriented to person, place, and time. He appears well-developed and well-nourished.  HENT:  Head: Normocephalic and atraumatic.  Right Ear: Hearing normal. Tympanic membrane is erythematous and bulging.  Left Ear: Hearing normal. Tympanic membrane is erythematous and bulging.  Neck: Normal range of motion. Neck supple. No JVD present. Carotid bruit is not present. No thyromegaly present.  Cardiovascular: Normal rate and regular rhythm.  Respiratory:  Congested breath sounds, bilateraly. Clear with cough.   GI: Soft. There is no tenderness.  Mildly hyperactive bowel sounds present.   Musculoskeletal: Normal range of motion.  Neurological: He is alert and oriented to person, place, and time.  Skin:     Psychiatric: He has a normal mood and affect. His behavior is normal. Judgment and thought content normal.      Assessment/Plan: 1. Acute non-recurrent pansinusitis - sulfamethoxazole-trimethoprim (BACTRIM DS,SEPTRA DS) 800-160 MG tablet; Take 1 tablet by mouth 2 (two) times daily.  Dispense: 20 tablet; Refill: 0  2. Chronic obstructive pulmonary disease with acute exacerbation (HCC) - albuterol (PROVENTIL HFA;VENTOLIN HFA) 108 (90 Base) MCG/ACT inhaler; Inhale 2 puffs into the lungs every 6 (six) hours as needed for wheezing or shortness of breath.  Dispense: 1 Inhaler; Refill: 3  3. Tinea corporis - clotrimazole-betamethasone (LOTRISONE) cream; Apply 1 application topically 2 (two) times daily.  Dispense: 30 g; Refill: 0  4. Mixed hyperlipidemia - atorvastatin (LIPITOR) 20 MG tablet; Take 1 tablet (20 mg total) by mouth daily.  Dispense: 30 tablet; Refill: 3 Check fasting lipid panel prior to next visit and adjust as  indicated   5. Barrett's esophagus without dysplasia - omeprazole (PRILOSEC) 20 MG capsule; Take 1 capsule (20 mg total) by mouth daily.  Dispense: 30 capsule; Refill: 3  General Counseling: Michael Christian verbalizes understanding of the findings of todays visit and agrees with plan of treatment. I have discussed any further diagnostic evaluation that may be needed or ordered today. We also reviewed his medications today. he has been encouraged to call the office with any questions or concerns that should arise related to todays visit.    This patient was seen by Vincent GrosHeather Aubreigh Fuerte, FNP- C in Collaboration with Dr Lyndon CodeFozia M Khan as a part of collaborative care agreement     Time spent: 20  Minutes     Dr Lyndon CodeFozia M Khan Internal medicine

## 2017-02-20 ENCOUNTER — Other Ambulatory Visit: Payer: Self-pay

## 2017-02-20 MED ORDER — FLUTICASONE FUROATE-VILANTEROL 100-25 MCG/INH IN AEPB: 1.0000 | INHALATION_SPRAY | Freq: Every day | RESPIRATORY_TRACT | 5 refills | Status: DC

## 2017-05-12 ENCOUNTER — Other Ambulatory Visit: Payer: Self-pay

## 2017-05-12 MED ORDER — ALBUTEROL SULFATE (2.5 MG/3ML) 0.083% IN NEBU: 2.5000 mg | INHALATION_SOLUTION | Freq: Four times a day (QID) | RESPIRATORY_TRACT | 3 refills | Status: DC | PRN

## 2017-05-26 ENCOUNTER — Other Ambulatory Visit: Payer: Self-pay

## 2017-05-26 DIAGNOSIS — K2270 Barrett's esophagus without dysplasia: Secondary | ICD-10-CM

## 2017-05-26 MED ORDER — OMEPRAZOLE 20 MG PO CPDR: 20.0000 mg | DELAYED_RELEASE_CAPSULE | Freq: Every day | ORAL | 3 refills | Status: DC

## 2017-06-03 ENCOUNTER — Encounter: Payer: Self-pay | Admitting: Internal Medicine

## 2017-06-03 ENCOUNTER — Ambulatory Visit: Payer: 59 | Admitting: Internal Medicine

## 2017-06-03 VITALS — BP 144/96 | HR 88 | Resp 16 | Ht 72.0 in | Wt 215.6 lb

## 2017-06-03 DIAGNOSIS — J301 Allergic rhinitis due to pollen: Secondary | ICD-10-CM

## 2017-06-03 DIAGNOSIS — J449 Chronic obstructive pulmonary disease, unspecified: Secondary | ICD-10-CM | POA: Diagnosis not present

## 2017-06-03 DIAGNOSIS — R0602 Shortness of breath: Secondary | ICD-10-CM | POA: Diagnosis not present

## 2017-06-03 DIAGNOSIS — K219 Gastro-esophageal reflux disease without esophagitis: Secondary | ICD-10-CM | POA: Diagnosis not present

## 2017-06-03 MED ORDER — TIOTROPIUM BROMIDE MONOHYDRATE 18 MCG IN CAPS: 18.0000 ug | ORAL_CAPSULE | Freq: Every day | RESPIRATORY_TRACT | 12 refills | Status: DC

## 2017-06-03 NOTE — Progress Notes (Signed)
[facility] [address] [place], [place] [address]  Pulmonary Sleep Medicine   Office Visit Note  Patient Name: Michael Christian DOB: March 02, 1960 MRN [id number]  Date of Service: 06/03/2017  Complaints/HPI:   Patient is here as a new patient has a diagnosis of COPD just moved from [place] to [place].  States that he has been having increasing symptoms slightly.  Says he has has more shortness of breath noted has no chest pain or tightness but feels definitely more short of breath.  He has been on 3 L and feels the Virgel Bouquet is not helping him much.  He denies having any cough no congestion at this time.  Has some wheezing noted on occasion.  States he is a former smoker quit about 3 years ago.  As far as his current work environment is concerned he says he has no significant exposure.  He does not have any formal evaluation with pulmonary functions.  He also has never had a cardiac evaluation.  The patient denies any dizziness feels sometimes week feels as if sometimes he cannot do even small tasks because of the shortness of breath.  ROS  General: (-) fever, (-) chills, (-) night sweats, (-) weakness Skin: (-) rashes, (-) itching,. Eyes: (-) visual changes, (-) redness, (-) itching. Nose and Sinuses: (-) nasal stuffiness or itchiness, (-) postnasal drip, (-) nosebleeds, (-) sinus trouble. Mouth and Throat: (-) sore throat, (-) hoarseness. Neck: (-) swollen glands, (-) enlarged thyroid, (-) neck pain. Respiratory: + cough, (-) bloody sputum, + shortness of breath, - wheezing. Cardiovascular: - ankle swelling, (-) chest pain. Lymphatic: (-) lymph node enlargement. Neurologic: (-) numbness, (-) tingling. Psychiatric: (-) anxiety, (-) depression   Current Medication: Outpatient Encounter Medications as of 06/03/2017  Medication Sig  . albuterol (PROVENTIL HFA;VENTOLIN HFA) 108 (90 Base) MCG/ACT inhaler Inhale 2 puffs into the lungs every 6 (six) hours as needed for  wheezing or shortness of breath.  Marland Kitchen albuterol (PROVENTIL) (2.5 MG/3ML) 0.083% nebulizer solution Take 3 mLs (2.5 mg total) by nebulization every 6 (six) hours as needed for wheezing or shortness of breath.  Marland Kitchen atorvastatin (LIPITOR) 20 MG tablet Take 1 tablet (20 mg total) by mouth daily.  . budesonide-formoterol (SYMBICORT) 160-4.5 MCG/ACT inhaler Inhale 2 puffs into the lungs 2 (two) times daily.  . clotrimazole-betamethasone (LOTRISONE) cream Apply 1 application topically 2 (two) times daily.  . fluticasone furoate-vilanterol (BREO ELLIPTA) 100-25 MCG/INH AEPB Inhale 1 puff into the lungs daily.  Marland Kitchen ibuprofen (MOTRIN IB) 200 MG tablet Take 3 tablets (600 mg total) by mouth every 6 (six) hours as needed.  Marland Kitchen omeprazole (PRILOSEC) 20 MG capsule Take 1 capsule (20 mg total) by mouth daily.  Marland Kitchen oxyCODONE-acetaminophen (ROXICET) 5-325 MG tablet Take 1 tablet by mouth every 6 (six) hours as needed.  . promethazine (PHENERGAN) 25 MG suppository Place 1 suppository (25 mg total) rectally every 6 (six) hours as needed for nausea.  Marland Kitchen sulfamethoxazole-trimethoprim (BACTRIM DS,SEPTRA DS) 800-160 MG tablet Take 1 tablet by mouth 2 (two) times daily.  . tamsulosin (FLOMAX) 0.4 MG CAPS capsule Take 1 capsule (0.4 mg total) by mouth daily.   No facility-administered encounter medications on file as of 06/03/2017.     Surgical History: Past Surgical History:  Procedure Laterality Date  . BACK SURGERY    . TONSILLECTOMY      Medical History: Past Medical History:  Diagnosis Date  . Barrett's esophagus without dysplasia   . Cervical disc disorder with radiculopathy,  unspecified cervical region   . Chronic obstructive pulmonary disease (COPD) (HCC)   . COPD (chronic obstructive pulmonary disease) (HCC)   . Mixed hyperlipidemia     Family History: Family History  Problem Relation Age of Onset  . Alzheimer's disease Father   . Lung cancer Brother   . Emphysema Brother     Social History: Social  History   Socioeconomic History  . Marital status: Married    Spouse name: Not on file  . Number of children: Not on file  . Years of education: Not on file  . Highest education level: Not on file  Occupational History  . Not on file  Social Needs  . Financial resource strain: Not on file  . Food insecurity:    Worry: Not on file    Inability: Not on file  . Transportation needs:    Medical: Not on file    Non-medical: Not on file  Tobacco Use  . Smoking status: Former Games developer  . Smokeless tobacco: Never Used  Substance and Sexual Activity  . Alcohol use: No  . Drug use: No  . Sexual activity: Not on file  Lifestyle  . Physical activity:    Days per week: Not on file    Minutes per session: Not on file  . Stress: Not on file  Relationships  . Social connections:    Talks on phone: Not on file    Gets together: Not on file    Attends religious service: Not on file    Active member of club or organization: Not on file    Attends meetings of clubs or organizations: Not on file    Relationship status: Not on file  . Intimate partner violence:    Fear of current or ex partner: Not on file    Emotionally abused: Not on file    Physically abused: Not on file    Forced sexual activity: Not on file  Other Topics Concern  . Not on file  Social History Narrative  . Not on file    Vital Signs: Blood pressure (!) 144/96, pulse 88, resp. rate 16, height 6' (1.829 m), weight 215 lb 9.6 oz (97.8 kg), SpO2 95 %.  Examination: General Appearance: The patient is well-developed, well-nourished, and in no distress. Skin: Gross inspection of skin unremarkable. Head: normocephalic, no gross deformities. Eyes: no gross deformities noted. ENT: ears appear grossly normal no exudates. Neck: Supple. No thyromegaly. No LAD. Respiratory: scattered rhonchi. Cardiovascular: Normal S1 and S2 without murmur or rub. Extremities: No cyanosis. pulses are equal. Neurologic: Alert and oriented.  No involuntary movements.  LABS: No results found for this or any previous visit (from the past 2160 hour(s)).  Radiology: Dg Chest 2 View  Result Date: 04/24/2016 CLINICAL DATA:  SOFT TISSUE COPD, EMPHYSEMA EXAM: CHEST  2 VIEW COMPARISON:  12/14/2014 CT chest FINDINGS: The lungs are hyperinflated likely secondary to COPD. There is bilateral chronic interstitial lung disease. There is no focal parenchymal opacity. There is no pleural effusion or pneumothorax. The heart and mediastinal contours are unremarkable. The osseous structures are unremarkable. IMPRESSION: No active cardiopulmonary disease. COPD. Electronically Signed   By: Elige Ko   On: 04/24/2016 08:57   Dg Cervical Spine Complete  Result Date: 04/24/2016 CLINICAL DATA:  Decreased grip on the right, initial encounter EXAM: CERVICAL SPINE - COMPLETE 4+ VIEW COMPARISON:  None. FINDINGS: Seven cervical segments are well visualized. Vertebral body height is well maintained. No significant disc pathology is noted.  Mild osteophytic changes are noted causing neural foraminal narrowing at C5-6 and C6-7 slightly worse on the left than the right. Mild facet hypertrophic changes are noted. The odontoid is within normal limits. IMPRESSION: Mild degenerative change without acute abnormality. Electronically Signed   By: Alcide Clever M.D.   On: 04/24/2016 09:00    No results found.  No results found.    Assessment and Plan: Patient Active Problem List   Diagnosis Date Noted  . Cervical disc disorder with radiculopathy, unspecified cervical region 01/31/2017  . Emphysema, unspecified (HCC) 01/31/2017  . COPD (chronic obstructive pulmonary disease) with emphysema (HCC) 01/31/2017  . Mixed hyperlipidemia 01/31/2017  . Barrett's esophagus without dysplasia 01/31/2017    1. COPD by history. He states that he has been on breo with some help but is not helping as it used to. He is not on spiriva at this time. Patient also has been on nebs and  also on prn albuterol. No recent PFT will get PFT start on symbicort he has script and also add Spiriva. Also would consider daliresp 2. SOB will setup for PFT also need to assess cardiac function make certain no cardiac cause for his SOB 3. GERD Has been on meds for this controlled 4. Allergic rhinitis. He states this feels different from his usual allergies  General Counseling: I have discussed the findings of the evaluation and examination with Blase.  I have also discussed any further diagnostic evaluation thatmay be needed or ordered today. Paxton verbalizes understanding of the findings of todays visit. We also reviewed his medications today and discussed drug interactions and side effects including but not limited excessive drowsiness and altered mental states. We also discussed that there is always a risk not just to him but also people around him. he has been encouraged to call the office with any questions or concerns that should arise related to todays visit.    Time spent:  I have personally obtained a history, examined the patient, evaluated laboratory and imaging results, formulated the assessment and plan and placed orders.    Yevonne Pax, MD [facility] Pulmonary and Critical Care Sleep medicine

## 2017-06-05 ENCOUNTER — Ambulatory Visit
Admission: RE | Admit: 2017-06-05 | Discharge: 2017-06-05 | Disposition: A | Discharge status: Home / Self Care | Payer: 59 | Source: Ambulatory Visit | Attending: Internal Medicine | Admitting: Internal Medicine

## 2017-06-05 ENCOUNTER — Other Ambulatory Visit: Payer: Self-pay | Admitting: Nurse Practitioner

## 2017-06-05 DIAGNOSIS — R918 Other nonspecific abnormal finding of lung field: Secondary | ICD-10-CM | POA: Diagnosis not present

## 2017-06-05 DIAGNOSIS — J449 Chronic obstructive pulmonary disease, unspecified: Secondary | ICD-10-CM | POA: Insufficient documentation

## 2017-06-06 LAB — COMPREHENSIVE METABOLIC PANEL
A/G RATIO: 1.6 (ref 1.2–2.2)
ALT: 20 IU/L (ref 0–44)
AST: 16 IU/L (ref 0–40)
Albumin: 4.1 g/dL (ref 3.5–5.5)
Alkaline Phosphatase: 90 IU/L (ref 39–117)
BUN/Creatinine Ratio: 20 (ref 9–20)
BUN: 15 mg/dL (ref 6–24)
CHLORIDE: 102 mmol/L (ref 96–106)
CO2: 26 mmol/L (ref 20–29)
Calcium: 9.3 mg/dL (ref 8.7–10.2)
Creatinine, Ser: 0.75 mg/dL — ABNORMAL LOW (ref 0.76–1.27)
GFR calc non Af Amer: 103 mL/min/{1.73_m2} (ref >59)
GFR, EST AFRICAN AMERICAN: 119 mL/min/{1.73_m2} (ref >59)
Globulin, Total: 2.6 g/dL (ref 1.5–4.5)
Glucose: 126 mg/dL — ABNORMAL HIGH (ref 65–99)
POTASSIUM: 5.2 mmol/L (ref 3.5–5.2)
SODIUM: 141 mmol/L (ref 134–144)
TOTAL PROTEIN: 6.7 g/dL (ref 6.0–8.5)

## 2017-06-06 LAB — CBC
Hematocrit: 45.7 % (ref 37.5–51.0)
Hemoglobin: 15.6 g/dL (ref 13.0–17.7)
MCH: 31.2 pg (ref 26.6–33.0)
MCHC: 34.1 g/dL (ref 31.5–35.7)
MCV: 91 fL (ref 79–97)
Platelets: 392 10*3/uL (ref 150–450)
RBC: 5 x10E6/uL (ref 4.14–5.80)
RDW: 14 % (ref 12.3–15.4)
WBC: 8.4 10*3/uL (ref 3.4–10.8)

## 2017-06-06 LAB — TSH: TSH: 1.6 u[IU]/mL (ref 0.450–4.500)

## 2017-06-06 LAB — LIPID PANEL W/O CHOL/HDL RATIO
Cholesterol, Total: 125 mg/dL (ref 100–199)
HDL: 36 mg/dL — AB (ref >39)
LDL CALC: 70 mg/dL (ref 0–99)
TRIGLYCERIDES: 95 mg/dL (ref 0–149)
VLDL Cholesterol Cal: 19 mg/dL (ref 5–40)

## 2017-06-06 LAB — PSA: Prostate Specific Ag, Serum: 0.7 ng/mL (ref 0.0–4.0)

## 2017-06-25 ENCOUNTER — Ambulatory Visit: Payer: 59 | Admitting: Internal Medicine

## 2017-06-25 DIAGNOSIS — R0602 Shortness of breath: Secondary | ICD-10-CM

## 2017-06-25 LAB — PULMONARY FUNCTION TEST

## 2017-06-27 ENCOUNTER — Ambulatory Visit: Payer: 59

## 2017-06-27 DIAGNOSIS — R0602 Shortness of breath: Secondary | ICD-10-CM

## 2017-07-01 ENCOUNTER — Ambulatory Visit: Payer: Self-pay | Admitting: Internal Medicine

## 2017-07-15 NOTE — Procedures (Signed)
[facility] [address] [place], [id number]  DATE OF SERVICE: June 25, 2017  Complete Pulmonary Function Testing Interpretation:  FINDINGS:   the forced vital capacity is moderately decreased.  The FEV1 is 1.27 L which is 31% of predicted and is severely decreased.  The FEV1 FVC ratio is severely decreased.  Post bronchodilator at there is no significant improvement in the FEV1 however clinical improvement may occur in the absence of spirometric improvement and does not preclude the use of bronchodilators.  The total lung capacity is within normal limits as measured by the plethysmography.  Residual volume is increased residual volume total lung capacity ratio is increased and the FRC is increased.  The DLCO is severely reduced.  IMPRESSION:    This pulmonary function study is consistent with severe obstructive lung disease.  There is some evidence to suggest air trapping and hyperinflation.  The DLCO is severely decreased  Yevonne PaxSaadat A Khan, MD [facility] Pul[facility] Critical Care Medicine Sleep Medicine

## 2017-07-21 ENCOUNTER — Ambulatory Visit: Payer: 59 | Admitting: Internal Medicine

## 2017-07-21 ENCOUNTER — Encounter: Payer: Self-pay | Admitting: Internal Medicine

## 2017-07-21 VITALS — BP 148/88 | HR 77 | Resp 16 | Ht 72.0 in | Wt 214.6 lb

## 2017-07-21 DIAGNOSIS — J301 Allergic rhinitis due to pollen: Secondary | ICD-10-CM | POA: Diagnosis not present

## 2017-07-21 DIAGNOSIS — K219 Gastro-esophageal reflux disease without esophagitis: Secondary | ICD-10-CM | POA: Diagnosis not present

## 2017-07-21 DIAGNOSIS — J449 Chronic obstructive pulmonary disease, unspecified: Secondary | ICD-10-CM | POA: Diagnosis not present

## 2017-07-21 DIAGNOSIS — K0889 Other specified disorders of teeth and supporting structures: Secondary | ICD-10-CM

## 2017-07-21 MED ORDER — BUDESONIDE-FORMOTEROL FUMARATE 80-4.5 MCG/ACT IN AERO: 2.0000 | INHALATION_SPRAY | Freq: Two times a day (BID) | RESPIRATORY_TRACT | 4 refills | Status: DC

## 2017-07-21 MED ORDER — TIOTROPIUM BROMIDE MONOHYDRATE 1.25 MCG/ACT IN AERS: 1.0000 | INHALATION_SPRAY | Freq: Every day | RESPIRATORY_TRACT | 4 refills | Status: DC

## 2017-07-21 NOTE — Patient Instructions (Signed)

## 2017-07-21 NOTE — Progress Notes (Signed)
[facility] [address] [place], [place] [address]  Pulmonary Sleep Medicine   Office Visit Note  Patient Name: Michael Christian DOB: 25-Oct-1960 MRN [id number]  Date of Service: 07/21/2017  Complaints/HPI: Patient is here for follow-up of COPD evaluation.  Today he complains of having severe tooth pain he states that he supposed to see a dentist but the the appointment is not until next week.  He is allergic to penicillin and therefore we have recommended that he go see Urgent Care to get the appropriate management 9 get in to see a dentist.  As far as his COPD is concerned he has severe disease we reviewed the pulmonary function study that was done recently.  Patient states that he is using the Symbicort which was given to him on the last visit.  States that this is helping him more than the Breo did.  Currently he is not on any anticholinergics I also gave him a sample of Spiriva.  Patient was also complaining of having some issues with allergies and he has not been recently evaluated for this so he will need to be set up for an allergy test evaluation.  ROS  General: (-) fever, (-) chills, (-) night sweats, (-) weakness Skin: (-) rashes, (-) itching,. Eyes: (-) visual changes, (-) redness, (-) itching. Nose and Sinuses: (-) nasal stuffiness or itchiness, (-) postnasal drip, (-) nosebleeds, (-) sinus trouble. Mouth and Throat: (-) sore throat, (-) hoarseness. Neck: (-) swollen glands, (-) enlarged thyroid, (-) neck pain. Respiratory: - cough, (-) bloody sputum, - shortness of breath, - wheezing. Cardiovascular: - ankle swelling, (-) chest pain. Lymphatic: (-) lymph node enlargement. Neurologic: (-) numbness, (-) tingling. Psychiatric: (-) anxiety, (-) depression   Current Medication: Outpatient Encounter Medications as of 07/21/2017  Medication Sig  . albuterol (PROVENTIL HFA;VENTOLIN HFA) 108 (90 Base) MCG/ACT inhaler Inhale 2 puffs into the lungs every 6 (six) hours as  needed for wheezing or shortness of breath.  Marland Kitchen albuterol (PROVENTIL) (2.5 MG/3ML) 0.083% nebulizer solution Take 3 mLs (2.5 mg total) by nebulization every 6 (six) hours as needed for wheezing or shortness of breath.  Marland Kitchen atorvastatin (LIPITOR) 20 MG tablet Take 1 tablet (20 mg total) by mouth daily.  . budesonide-formoterol (SYMBICORT) 160-4.5 MCG/ACT inhaler Inhale 2 puffs into the lungs 2 (two) times daily.  . clotrimazole-betamethasone (LOTRISONE) cream Apply 1 application topically 2 (two) times daily.  Marland Kitchen ibuprofen (MOTRIN IB) 200 MG tablet Take 3 tablets (600 mg total) by mouth every 6 (six) hours as needed.  Marland Kitchen omeprazole (PRILOSEC) 20 MG capsule Take 1 capsule (20 mg total) by mouth daily.  . fluticasone furoate-vilanterol (BREO ELLIPTA) 100-25 MCG/INH AEPB Inhale 1 puff into the lungs daily. (Patient not taking: Reported on 07/21/2017)  . [DISCONTINUED] oxyCODONE-acetaminophen (ROXICET) 5-325 MG tablet Take 1 tablet by mouth every 6 (six) hours as needed. (Patient not taking: Reported on 07/21/2017)  . [DISCONTINUED] promethazine (PHENERGAN) 25 MG suppository Place 1 suppository (25 mg total) rectally every 6 (six) hours as needed for nausea.  . [DISCONTINUED] sulfamethoxazole-trimethoprim (BACTRIM DS,SEPTRA DS) 800-160 MG tablet Take 1 tablet by mouth 2 (two) times daily. (Patient not taking: Reported on 07/21/2017)  . [DISCONTINUED] tamsulosin (FLOMAX) 0.4 MG CAPS capsule Take 1 capsule (0.4 mg total) by mouth daily. (Patient not taking: Reported on 07/21/2017)  . [DISCONTINUED] tiotropium (SPIRIVA HANDIHALER) 18 MCG inhalation capsule Place 1 capsule (18 mcg total) into inhaler and inhale daily. (Patient not taking: Reported on 07/21/2017)   No facility-administered  encounter medications on file as of 07/21/2017.     Surgical History: Past Surgical History:  Procedure Laterality Date  . BACK SURGERY    . TONSILLECTOMY      Medical History: Past Medical History:  Diagnosis Date  . Barrett's  esophagus without dysplasia   . Cervical disc disorder with radiculopathy, unspecified cervical region   . Chronic obstructive pulmonary disease (COPD) (HCC)   . COPD (chronic obstructive pulmonary disease) (HCC)   . Mixed hyperlipidemia     Family History: Family History  Problem Relation Age of Onset  . Alzheimer's disease Father   . Lung cancer Brother   . Emphysema Brother     Social History: Social History   Socioeconomic History  . Marital status: Married    Spouse name: Not on file  . Number of children: Not on file  . Years of education: Not on file  . Highest education level: Not on file  Occupational History  . Not on file  Social Needs  . Financial resource strain: Not on file  . Food insecurity:    Worry: Not on file    Inability: Not on file  . Transportation needs:    Medical: Not on file    Non-medical: Not on file  Tobacco Use  . Smoking status: Former Games developermoker  . Smokeless tobacco: Never Used  Substance and Sexual Activity  . Alcohol use: No  . Drug use: No  . Sexual activity: Not on file  Lifestyle  . Physical activity:    Days per week: Not on file    Minutes per session: Not on file  . Stress: Not on file  Relationships  . Social connections:    Talks on phone: Not on file    Gets together: Not on file    Attends religious service: Not on file    Active member of club or organization: Not on file    Attends meetings of clubs or organizations: Not on file    Relationship status: Not on file  . Intimate partner violence:    Fear of current or ex partner: Not on file    Emotionally abused: Not on file    Physically abused: Not on file    Forced sexual activity: Not on file  Other Topics Concern  . Not on file  Social History Narrative  . Not on file    Vital Signs: Blood pressure (!) 148/88, pulse 77, resp. rate 16, height 6' (1.829 m), weight 214 lb 9.6 oz (97.3 kg), SpO2 93 %.  Examination: General Appearance: The patient is  well-developed, well-nourished, and in no distress. Skin: Gross inspection of skin unremarkable. Head: normocephalic, no gross deformities. Eyes: no gross deformities noted. ENT: ears appear grossly normal no exudates. Neck: Supple. No thyromegaly. No LAD. Respiratory: no rhonchi noted at this time. Cardiovascular: Normal S1 and S2 without murmur or rub. Extremities: No cyanosis. pulses are equal. Neurologic: Alert and oriented. No involuntary movements.  LABS: Recent Results (from the past 2160 hour(s))  Comprehensive metabolic panel     Status: Abnormal   Collection Time: 06/05/17  8:01 AM  Result Value Ref Range   Glucose 126 (H) 65 - 99 mg/dL   BUN 15 6 - 24 mg/dL   Creatinine, Ser 7.250.75 (L) 0.76 - 1.27 mg/dL   GFR calc non Af Amer 103 >59 mL/min/1.73   GFR calc Af Amer 119 >59 mL/min/1.73   BUN/Creatinine Ratio 20 9 - 20   Sodium 141 134 -  144 mmol/L   Potassium 5.2 3.5 - 5.2 mmol/L   Chloride 102 96 - 106 mmol/L   CO2 26 20 - 29 mmol/L   Calcium 9.3 8.7 - 10.2 mg/dL   Total Protein 6.7 6.0 - 8.5 g/dL   Albumin 4.1 3.5 - 5.5 g/dL   Globulin, Total 2.6 1.5 - 4.5 g/dL   Albumin/Globulin Ratio 1.6 1.2 - 2.2   Bilirubin Total <0.2 0.0 - 1.2 mg/dL   Alkaline Phosphatase 90 39 - 117 IU/L   AST 16 0 - 40 IU/L   ALT 20 0 - 44 IU/L  CBC     Status: None   Collection Time: 06/05/17  8:01 AM  Result Value Ref Range   WBC 8.4 3.4 - 10.8 x10E3/uL   RBC 5.00 4.14 - 5.80 x10E6/uL   Hemoglobin 15.6 13.0 - 17.7 g/dL   Hematocrit 16.1 09.6 - 51.0 %   MCV 91 79 - 97 fL   MCH 31.2 26.6 - 33.0 pg   MCHC 34.1 31.5 - 35.7 g/dL   RDW 04.5 40.9 - 81.1 %   Platelets 392 150 - 450 x10E3/uL    Comment:               **Please note reference interval change**  Lipid Panel w/o Chol/HDL Ratio     Status: Abnormal   Collection Time: 06/05/17  8:01 AM  Result Value Ref Range   Cholesterol, Total 125 100 - 199 mg/dL   Triglycerides 95 0 - 149 mg/dL   HDL 36 (L) >91 mg/dL   VLDL Cholesterol Cal  19 5 - 40 mg/dL   LDL Calculated 70 0 - 99 mg/dL  TSH     Status: None   Collection Time: 06/05/17  8:01 AM  Result Value Ref Range   TSH 1.600 0.450 - 4.500 uIU/mL  PSA     Status: None   Collection Time: 06/05/17  8:01 AM  Result Value Ref Range   Prostate Specific Ag, Serum 0.7 0.0 - 4.0 ng/mL    Comment: Roche ECLIA methodology. According to the American Urological Association, Serum PSA should decrease and remain at undetectable levels after radical prostatectomy. The AUA defines biochemical recurrence as an initial PSA value 0.2 ng/mL or greater followed by a subsequent confirmatory PSA value 0.2 ng/mL or greater. Values obtained with different assay methods or kits cannot be used interchangeably. Results cannot be interpreted as absolute evidence of the presence or absence of malignant disease.     Radiology: Dg Chest 2 View  Result Date: 06/05/2017 CLINICAL DATA:  COPD.  Difficulty breathing. EXAM: CHEST - 2 VIEW COMPARISON:  04/24/2016. FINDINGS: Mediastinum is normal. Heart size normal. No pulmonary venous congestion. Diffuse interstitial prominence noted consistent with chronic interstitial lung disease. No pleural effusion or pneumothorax. No acute bony abnormality. IMPRESSION: Diffuse bilateral pulmonary interstitial prominence noted consistent chronic interstitial lung disease. Similar findings on prior exam. Electronically Signed   By: Maisie Fus  Register   On: 06/05/2017 12:59    No results found.  No results found.    Assessment and Plan: Patient Active Problem List   Diagnosis Date Noted  . Cervical disc disorder with radiculopathy, unspecified cervical region 01/31/2017  . Emphysema, unspecified (HCC) 01/31/2017  . COPD (chronic obstructive pulmonary disease) with emphysema (HCC) 01/31/2017  . Mixed hyperlipidemia 01/31/2017  . Barrett's esophagus without dysplasia 01/31/2017    1. COPD severe we discussed above on proper usage of inhalers.  Also discuss  the addition of Spiriva.  He was shown how to use the Spiriva restpimet appropriately.  Also will continue using albuterol rescue as needed 2. GERD stable at this time states he has no acute symptoms since is been using his medications 3. Allergic rhinitis will be evaluated with allergy tests 4. Tooth Pain referred to urgent care for further evaluation and also needs to be seen by his dentist Which ihs appointment is scheduled for Monday  General Counseling: I have discussed the findings of the evaluation and examination with Arath.  I have also discussed any further diagnostic evaluation thatmay be needed or ordered today. Lido verbalizes understanding of the findings of todays visit. We also reviewed his medications today and discussed drug interactions and side effects including but not limited excessive drowsiness and altered mental states. We also discussed that there is always a risk not just to him but also people around him. he has been encouraged to call the office with any questions or concerns that should arise related to todays visit.  Orders Placed This Encounter  Procedures  . Allergy Test    Order Specific Question:   Allergy test to perform    Answer:   s[place]     Time spent:  I have personally obtained a history, examined the patient, evaluated laboratory and imaging results, formulated the assessment and plan and placed orders.    Yevonne Pax, MD [facility] Pulmonary and Critical Care Sleep medicine

## 2017-07-23 ENCOUNTER — Telehealth: Payer: Self-pay

## 2017-07-24 ENCOUNTER — Other Ambulatory Visit: Payer: Self-pay

## 2017-07-24 MED ORDER — IPRATROPIUM-ALBUTEROL 20-100 MCG/ACT IN AERS: 1.0000 | INHALATION_SPRAY | Freq: Three times a day (TID) | RESPIRATORY_TRACT | 3 refills | Status: DC

## 2017-07-24 MED ORDER — FLUTICASONE PROPIONATE HFA 220 MCG/ACT IN AERO: 2.0000 | INHALATION_SPRAY | Freq: Two times a day (BID) | RESPIRATORY_TRACT | 3 refills | Status: DC

## 2017-07-24 NOTE — Telephone Encounter (Signed)
SPOKE WITH DR.KHAN AND SHE SAID TO SEND IN FLOVENT 220 2 PUFFS TWICE DAILY AND COMBIVENT 1 PUFF 3 TIMES DAILY AND TELL PT SHE WILL WORK ON NEBULIZER. I SENT IN THESE MEDICATIONS AND NOTIFIED PT OF ABOVE.

## 2017-07-24 NOTE — Telephone Encounter (Signed)
SPOKE WITH DR. Welton FlakesKHAN. I CALLED PT BUT HE DID NOT ANSWER AND I LMOM TO CALL BACK. IF HE CALLS WE HAVE TO ASK IF THERE IS ANOTHER INHALER THAT IS COVERED BY HIS INSURANCE OR WOULD HE LIKE TO USE A NEBULIZER LIKE THE ONE HE RECEIVES DURING HIS PFT.

## 2017-07-24 NOTE — Telephone Encounter (Signed)
SPOKE WITH PT AND HE IS UNAWARE OF ANY INHALERS THAT MAY BE COVERED AND I ASKED HIS PHARMACY AS WELL AND THEY DID NOT KNOW. PT CALLED BACK AND SAID THAT IF A NEBULIZER WAS PRESCRIBED HE DOES NOT MIND

## 2017-07-25 ENCOUNTER — Other Ambulatory Visit: Payer: Self-pay | Admitting: Nurse Practitioner

## 2017-07-25 DIAGNOSIS — J449 Chronic obstructive pulmonary disease, unspecified: Secondary | ICD-10-CM

## 2017-07-25 MED ORDER — BUDESONIDE-FORMOTEROL FUMARATE 80-4.5 MCG/ACT IN AERO: 2.0000 | INHALATION_SPRAY | Freq: Two times a day (BID) | RESPIRATORY_TRACT | 4 refills | Status: DC

## 2017-07-25 MED ORDER — TIOTROPIUM BROMIDE MONOHYDRATE 1.25 MCG/ACT IN AERS: 1.0000 | INHALATION_SPRAY | Freq: Every day | RESPIRATORY_TRACT | 4 refills | Status: DC

## 2017-07-29 ENCOUNTER — Other Ambulatory Visit: Payer: Self-pay

## 2017-07-29 ENCOUNTER — Telehealth: Payer: Self-pay

## 2017-07-29 MED ORDER — TIOTROPIUM BROMIDE MONOHYDRATE 18 MCG IN CAPS: 18.0000 ug | ORAL_CAPSULE | Freq: Every day | RESPIRATORY_TRACT | 3 refills | Status: DC

## 2017-07-29 NOTE — Telephone Encounter (Signed)
PT NEEDED A RX SENT [facility]. WE FAXED THE SCRIPT TO [phone] PER DFK.

## 2017-08-14 ENCOUNTER — Ambulatory Visit: Payer: Self-pay | Admitting: Internal Medicine

## 2017-08-20 ENCOUNTER — Other Ambulatory Visit: Payer: Self-pay

## 2017-08-20 DIAGNOSIS — J441 Chronic obstructive pulmonary disease with (acute) exacerbation: Secondary | ICD-10-CM

## 2017-08-20 MED ORDER — ALBUTEROL SULFATE HFA 108 (90 BASE) MCG/ACT IN AERS: 2.0000 | INHALATION_SPRAY | Freq: Four times a day (QID) | RESPIRATORY_TRACT | 3 refills | Status: DC | PRN

## 2017-09-29 ENCOUNTER — Other Ambulatory Visit: Payer: Self-pay | Admitting: Nurse Practitioner

## 2017-09-29 DIAGNOSIS — K2270 Barrett's esophagus without dysplasia: Secondary | ICD-10-CM

## 2017-09-29 DIAGNOSIS — E782 Mixed hyperlipidemia: Secondary | ICD-10-CM

## 2017-09-29 MED ORDER — ATORVASTATIN CALCIUM 20 MG PO TABS: 20.0000 mg | ORAL_TABLET | Freq: Every day | ORAL | 3 refills | Status: DC

## 2017-09-29 MED ORDER — OMEPRAZOLE 20 MG PO CPDR: 20.0000 mg | DELAYED_RELEASE_CAPSULE | Freq: Every day | ORAL | 3 refills | Status: DC

## 2017-09-30 ENCOUNTER — Other Ambulatory Visit: Payer: Self-pay

## 2017-09-30 MED ORDER — ALBUTEROL SULFATE (2.5 MG/3ML) 0.083% IN NEBU: 2.5000 mg | INHALATION_SOLUTION | Freq: Four times a day (QID) | RESPIRATORY_TRACT | 3 refills | Status: DC | PRN

## 2017-11-12 ENCOUNTER — Encounter: Payer: Self-pay | Admitting: Adult Health

## 2017-11-12 ENCOUNTER — Telehealth: Payer: Self-pay

## 2017-11-12 ENCOUNTER — Ambulatory Visit: Payer: PRIVATE HEALTH INSURANCE | Admitting: Adult Health

## 2017-11-12 VITALS — BP 128/89 | HR 75 | Resp 16 | Ht 72.0 in | Wt 214.0 lb

## 2017-11-12 DIAGNOSIS — J449 Chronic obstructive pulmonary disease, unspecified: Secondary | ICD-10-CM

## 2017-11-12 DIAGNOSIS — K219 Gastro-esophageal reflux disease without esophagitis: Secondary | ICD-10-CM | POA: Diagnosis not present

## 2017-11-12 DIAGNOSIS — E782 Mixed hyperlipidemia: Secondary | ICD-10-CM

## 2017-11-12 DIAGNOSIS — J301 Allergic rhinitis due to pollen: Secondary | ICD-10-CM

## 2017-11-12 DIAGNOSIS — J4489 Other specified chronic obstructive pulmonary disease: Secondary | ICD-10-CM

## 2017-11-12 MED ORDER — BUDESONIDE-FORMOTEROL FUMARATE 80-4.5 MCG/ACT IN AERO: 2.0000 | INHALATION_SPRAY | Freq: Two times a day (BID) | RESPIRATORY_TRACT | 4 refills | Status: DC

## 2017-11-12 MED ORDER — TIOTROPIUM BROMIDE MONOHYDRATE 18 MCG IN CAPS: 18.0000 ug | ORAL_CAPSULE | Freq: Every day | RESPIRATORY_TRACT | 3 refills | Status: DC

## 2017-11-12 NOTE — Patient Instructions (Signed)

## 2017-11-12 NOTE — Telephone Encounter (Signed)
Faxed pres for symbicort and spriva to [facility] [phone]

## 2017-11-12 NOTE — Progress Notes (Signed)
[facility] [address] [place], [place] [address]  Internal MEDICINE  Office Visit Note  Patient Name: Michael Christian  [id number]  [id number]  Date of Service: 11/12/2017  Chief Complaint  Patient presents with  . Hyperlipidemia    medication refills   . COPD    HPI Patient presents for follow-up on hyperlipidemia, and COPD.  He reports he is unable to afford his Spiriva and Symbicort inhalers so he gets them from a [place] pharmacy.  We typically fax these prescriptions to pharmacy and he receives them in approximately 1 month.  He reports that he has enough inhalers currently last until these new ones arrive, so new prescriptions will be faxed today.  He reports that his breathing has been doing well.  He denies any chest pain, overt shortness of breath, or other symptoms.  His most recent lipid panel shows good control of his hyperlipidemia using Lipitor.  He denies any other needs at this time.    Current Medication: Outpatient Encounter Medications as of 11/12/2017  Medication Sig  . albuterol (PROVENTIL HFA;VENTOLIN HFA) 108 (90 Base) MCG/ACT inhaler Inhale 2 puffs into the lungs every 6 (six) hours as needed for wheezing or shortness of breath.  Marland Kitchen albuterol (PROVENTIL) (2.5 MG/3ML) 0.083% nebulizer solution Take 3 mLs (2.5 mg total) by nebulization every 6 (six) hours as needed for wheezing or shortness of breath.  Marland Kitchen atorvastatin (LIPITOR) 20 MG tablet Take 1 tablet (20 mg total) by mouth daily.  . budesonide-formoterol (SYMBICORT) 80-4.5 MCG/ACT inhaler Inhale 2 puffs into the lungs 2 (two) times daily.  . clotrimazole-betamethasone (LOTRISONE) cream Apply 1 application topically 2 (two) times daily.  . fluticasone (FLOVENT HFA) 220 MCG/ACT inhaler Inhale 2 puffs into the lungs 2 (two) times daily.  Marland Kitchen ibuprofen (MOTRIN IB) 200 MG tablet Take 3 tablets (600 mg total) by mouth every 6 (six) hours as needed.  . Ipratropium-Albuterol (COMBIVENT RESPIMAT) 20-100  MCG/ACT AERS respimat Inhale 1 puff into the lungs 3 (three) times daily.  Marland Kitchen omeprazole (PRILOSEC) 20 MG capsule Take 1 capsule (20 mg total) by mouth daily.  Marland Kitchen tiotropium (SPIRIVA HANDIHALER) 18 MCG inhalation capsule Place 1 capsule (18 mcg total) into inhaler and inhale daily.  . [DISCONTINUED] budesonide-formoterol (SYMBICORT) 80-4.5 MCG/ACT inhaler Inhale 2 puffs into the lungs 2 (two) times daily.  . [DISCONTINUED] tiotropium (SPIRIVA HANDIHALER) 18 MCG inhalation capsule Place 1 capsule (18 mcg total) into inhaler and inhale daily.  . [DISCONTINUED] fluticasone furoate-vilanterol (BREO ELLIPTA) 100-25 MCG/INH AEPB Inhale 1 puff into the lungs daily. (Patient not taking: Reported on 07/21/2017)   No facility-administered encounter medications on file as of 11/12/2017.     Surgical History: Past Surgical History:  Procedure Laterality Date  . BACK SURGERY    . TONSILLECTOMY      Medical History: Past Medical History:  Diagnosis Date  . Barrett's esophagus without dysplasia   . Cervical disc disorder with radiculopathy, unspecified cervical region   . Chronic obstructive pulmonary disease (COPD) (HCC)   . COPD (chronic obstructive pulmonary disease) (HCC)   . Mixed hyperlipidemia     Family History: Family History  Problem Relation Age of Onset  . Alzheimer's disease Father   . Lung cancer Brother   . Emphysema Brother     Social History   Socioeconomic History  . Marital status: Married    Spouse name: Not on file  . Number of children: Not on file  . Years of education: Not on file  .  Highest education level: Not on file  Occupational History  . Not on file  Social Needs  . Financial resource strain: Not on file  . Food insecurity:    Worry: Not on file    Inability: Not on file  . Transportation needs:    Medical: Not on file    Non-medical: Not on file  Tobacco Use  . Smoking status: Former Games developer  . Smokeless tobacco: Never Used  Substance and Sexual  Activity  . Alcohol use: No  . Drug use: No  . Sexual activity: Not on file  Lifestyle  . Physical activity:    Days per week: Not on file    Minutes per session: Not on file  . Stress: Not on file  Relationships  . Social connections:    Talks on phone: Not on file    Gets together: Not on file    Attends religious service: Not on file    Active member of club or organization: Not on file    Attends meetings of clubs or organizations: Not on file    Relationship status: Not on file  . Intimate partner violence:    Fear of current or ex partner: Not on file    Emotionally abused: Not on file    Physically abused: Not on file    Forced sexual activity: Not on file  Other Topics Concern  . Not on file  Social History Narrative  . Not on file      Review of Systems  Constitutional: Negative.  Negative for chills, fatigue and unexpected weight change.  HENT: Negative.  Negative for congestion, rhinorrhea, sneezing and sore throat.   Eyes: Negative for redness.  Respiratory: Negative.  Negative for cough, chest tightness and shortness of breath.   Cardiovascular: Negative.  Negative for chest pain and palpitations.  Gastrointestinal: Negative.  Negative for abdominal pain, constipation, diarrhea, nausea and vomiting.  Endocrine: Negative.   Genitourinary: Negative.  Negative for dysuria and frequency.  Musculoskeletal: Negative.  Negative for arthralgias, back pain, joint swelling and neck pain.  Skin: Negative.  Negative for rash.  Allergic/Immunologic: Negative.   Neurological: Negative.  Negative for tremors and numbness.  Hematological: Negative for adenopathy. Does not bruise/bleed easily.  Psychiatric/Behavioral: Negative.  Negative for behavioral problems, sleep disturbance and suicidal ideas. The patient is not nervous/anxious.     Vital Signs: BP 128/89   Pulse 75   Resp 16   Ht 6' (1.829 m)   Wt 214 lb (97.1 kg)   SpO2 98%   BMI 29.02 kg/m    Physical  Exam  Constitutional: He is oriented to person, place, and time. He appears well-developed and well-nourished. No distress.  HENT:  Head: Normocephalic and atraumatic.  Mouth/Throat: Oropharynx is clear and moist. No oropharyngeal exudate.  Eyes: Pupils are equal, round, and reactive to light. EOM are normal.  Neck: Normal range of motion. Neck supple. No JVD present. No tracheal deviation present. No thyromegaly present.  Cardiovascular: Normal rate, regular rhythm and normal heart sounds. Exam reveals no gallop and no friction rub.  No murmur heard. Pulmonary/Chest: Effort normal and breath sounds normal. No respiratory distress. He has no wheezes. He has no rales. He exhibits no tenderness.  Abdominal: Soft. There is no tenderness. There is no guarding.  Musculoskeletal: Normal range of motion.  Lymphadenopathy:    He has no cervical adenopathy.  Neurological: He is alert and oriented to person, place, and time. No cranial nerve deficit.  Skin:  Skin is warm and dry. He is not diaphoretic.  Psychiatric: He has a normal mood and affect. His behavior is normal. Judgment and thought content normal.  Nursing note and vitals reviewed.   Assessment/Plan: 1. Obstructive chronic bronchitis without exacerbation (HCC) Patients COPD is stable at this time.  Symbicort Spiriva and inhalers prescription printed and faxed to [place] pharmacy per patient request. - budesonide-formoterol (SYMBICORT) 80-4.5 MCG/ACT inhaler; Inhale 2 puffs into the lungs 2 (two) times daily.  Dispense: 1 Inhaler; Refill: 4 - tiotropium (SPIRIVA HANDIHALER) 18 MCG inhalation capsule; Place 1 capsule (18 mcg total) into inhaler and inhale daily.  Dispense: 90 capsule; Refill: 3  2. Gastroesophageal reflux disease without esophagitis GERD appears well controlled at this time using omeprazole.  Continue current therapy.  3. Mixed hyperlipidemia Lipid panel stable.  Continue using Lipitor as directed.  4. Seasonal  allergic rhinitis due to pollen Patient denies any excessive symptoms of allergies.  He does not currently take any daily allergy medication.   General Counseling: Michael Christian verbalizes understanding of the findings of todays visit and agrees with plan of treatment. I have discussed any further diagnostic evaluation that may be needed or ordered today. We also reviewed his medications today. he has been encouraged to call the office with any questions or concerns that should arise related to todays visit.    No orders of the defined types were placed in this encounter.   Meds ordered this encounter  Medications  . budesonide-formoterol (SYMBICORT) 80-4.5 MCG/ACT inhaler    Sig: Inhale 2 puffs into the lungs 2 (two) times daily.    Dispense:  1 Inhaler    Refill:  4  . tiotropium (SPIRIVA HANDIHALER) 18 MCG inhalation capsule    Sig: Place 1 capsule (18 mcg total) into inhaler and inhale daily.    Dispense:  90 capsule    Refill:  3    Time spent: 25 Minutes   This patient was seen by Blima Ledger AGNP-C in Collaboration with Dr Lyndon Code as a part of collaborative care agreement     Johnna Acosta AGNP-C Internal medicine

## 2017-11-24 ENCOUNTER — Ambulatory Visit: Payer: Self-pay | Admitting: Internal Medicine

## 2018-01-30 ENCOUNTER — Other Ambulatory Visit: Payer: Self-pay

## 2018-01-30 DIAGNOSIS — J441 Chronic obstructive pulmonary disease with (acute) exacerbation: Secondary | ICD-10-CM

## 2018-01-30 MED ORDER — ALBUTEROL SULFATE HFA 108 (90 BASE) MCG/ACT IN AERS: 2.0000 | INHALATION_SPRAY | Freq: Four times a day (QID) | RESPIRATORY_TRACT | 3 refills | Status: DC | PRN

## 2018-02-16 ENCOUNTER — Other Ambulatory Visit: Payer: Self-pay

## 2018-02-16 ENCOUNTER — Telehealth: Payer: Self-pay

## 2018-02-16 DIAGNOSIS — J449 Chronic obstructive pulmonary disease, unspecified: Secondary | ICD-10-CM

## 2018-02-16 MED ORDER — TIOTROPIUM BROMIDE MONOHYDRATE 18 MCG IN CAPS: 18.0000 ug | ORAL_CAPSULE | Freq: Every day | RESPIRATORY_TRACT | 3 refills | Status: DC

## 2018-02-16 MED ORDER — BUDESONIDE-FORMOTEROL FUMARATE 80-4.5 MCG/ACT IN AERO: 2.0000 | INHALATION_SPRAY | Freq: Two times a day (BID) | RESPIRATORY_TRACT | 3 refills | Status: DC

## 2018-02-16 NOTE — Telephone Encounter (Signed)
Faxed symbicort and spiriva to [facility] [place] [phone]

## 2018-02-17 ENCOUNTER — Other Ambulatory Visit: Payer: Self-pay

## 2018-02-17 DIAGNOSIS — E782 Mixed hyperlipidemia: Secondary | ICD-10-CM

## 2018-02-17 DIAGNOSIS — K2270 Barrett's esophagus without dysplasia: Secondary | ICD-10-CM

## 2018-02-17 MED ORDER — ATORVASTATIN CALCIUM 20 MG PO TABS: 20.0000 mg | ORAL_TABLET | Freq: Every day | ORAL | 3 refills | Status: DC

## 2018-02-17 MED ORDER — OMEPRAZOLE 20 MG PO CPDR: 20.0000 mg | DELAYED_RELEASE_CAPSULE | Freq: Every day | ORAL | 3 refills | Status: DC

## 2018-02-26 ENCOUNTER — Other Ambulatory Visit: Payer: Self-pay | Admitting: Adult Health

## 2018-02-26 ENCOUNTER — Other Ambulatory Visit: Payer: Self-pay

## 2018-02-26 DIAGNOSIS — J449 Chronic obstructive pulmonary disease, unspecified: Secondary | ICD-10-CM

## 2018-03-09 ENCOUNTER — Other Ambulatory Visit: Payer: Self-pay

## 2018-03-09 ENCOUNTER — Telehealth: Payer: Self-pay

## 2018-03-09 MED ORDER — ALBUTEROL SULFATE (2.5 MG/3ML) 0.083% IN NEBU: 2.5000 mg | INHALATION_SOLUTION | Freq: Four times a day (QID) | RESPIRATORY_TRACT | 3 refills | Status: DC | PRN

## 2018-03-09 NOTE — Telephone Encounter (Signed)
ERROR

## 2018-05-11 ENCOUNTER — Telehealth: Payer: Self-pay

## 2018-05-11 ENCOUNTER — Ambulatory Visit: Payer: PRIVATE HEALTH INSURANCE | Admitting: Nurse Practitioner

## 2018-05-11 ENCOUNTER — Other Ambulatory Visit: Payer: Self-pay

## 2018-05-11 ENCOUNTER — Encounter: Payer: Self-pay | Admitting: Nurse Practitioner

## 2018-05-11 DIAGNOSIS — J441 Chronic obstructive pulmonary disease with (acute) exacerbation: Secondary | ICD-10-CM

## 2018-05-11 DIAGNOSIS — E782 Mixed hyperlipidemia: Secondary | ICD-10-CM | POA: Diagnosis not present

## 2018-05-11 DIAGNOSIS — K2270 Barrett's esophagus without dysplasia: Secondary | ICD-10-CM

## 2018-05-11 DIAGNOSIS — J449 Chronic obstructive pulmonary disease, unspecified: Secondary | ICD-10-CM | POA: Insufficient documentation

## 2018-05-11 MED ORDER — IPRATROPIUM-ALBUTEROL 20-100 MCG/ACT IN AERS: 1.0000 | INHALATION_SPRAY | Freq: Four times a day (QID) | RESPIRATORY_TRACT | 5 refills | Status: DC | PRN

## 2018-05-11 MED ORDER — BUDESONIDE-FORMOTEROL FUMARATE 80-4.5 MCG/ACT IN AERO: 2.0000 | INHALATION_SPRAY | Freq: Two times a day (BID) | RESPIRATORY_TRACT | 3 refills | Status: DC

## 2018-05-11 MED ORDER — OMEPRAZOLE 20 MG PO CPDR: 20.0000 mg | DELAYED_RELEASE_CAPSULE | Freq: Every day | ORAL | 5 refills | Status: DC

## 2018-05-11 MED ORDER — TIOTROPIUM BROMIDE MONOHYDRATE 18 MCG IN CAPS: 18.0000 ug | ORAL_CAPSULE | Freq: Every day | RESPIRATORY_TRACT | 3 refills | Status: DC

## 2018-05-11 MED ORDER — ALBUTEROL SULFATE HFA 108 (90 BASE) MCG/ACT IN AERS: 2.0000 | INHALATION_SPRAY | Freq: Four times a day (QID) | RESPIRATORY_TRACT | 5 refills | Status: DC | PRN

## 2018-05-11 MED ORDER — ATORVASTATIN CALCIUM 20 MG PO TABS: 20.0000 mg | ORAL_TABLET | Freq: Every day | ORAL | 5 refills | Status: DC

## 2018-05-11 NOTE — Progress Notes (Signed)
[facility] - Inpatient [address] [place], [place] [address]  Internal MEDICINE  Telephone Visit  Patient Name: Michael Christian  [id number]  [id number]  Date of Service: 05/11/2018  I connected with the patient at 9:20am  by webcam and verified the patients identity using two identifiers.   I discussed the limitations, risks, security and privacy concerns of performing an evaluation and management service by webcam  and the availability of in person appointments. I also discussed with the patient that there may be a patient responsible charge related to the service.  The patient expressed understanding and agrees to proceed.    Chief Complaint  Patient presents with  . Telephone Screen    VIDEO VISIT [phone]  . Telephone Assessment  . Medical Management of Chronic Issues    6 month follow up  . Hyperlipidemia  . COPD    The patient has been contacted via webcam for follow up visit due to concerns for spread of novel coronavirus. The patient is complaining of worsening allergies. States that he does need to have medication refills. Overall, asthma/COPD is stable. No changes. Is due to have some routine, fasting labs done. Would like to do this prior to his next routine visit.       Current Medication: Outpatient Encounter Medications as of 05/11/2018  Medication Sig  . albuterol (PROVENTIL) (2.5 MG/3ML) 0.083% nebulizer solution Take 3 mLs (2.5 mg total) by nebulization every 6 (six) hours as needed for wheezing or shortness of breath.  Marland Kitchen albuterol (VENTOLIN HFA) 108 (90 Base) MCG/ACT inhaler Inhale 2 puffs into the lungs every 6 (six) hours as needed for wheezing or shortness of breath.  Marland Kitchen atorvastatin (LIPITOR) 20 MG tablet Take 1 tablet (20 mg total) by mouth daily.  . budesonide-formoterol (SYMBICORT) 80-4.5 MCG/ACT inhaler Inhale 2 puffs into the lungs 2 (two) times daily.  . clotrimazole-betamethasone (LOTRISONE) cream Apply 1 application topically 2 (two) times daily.   . fluticasone (FLOVENT HFA) 220 MCG/ACT inhaler Inhale 2 puffs into the lungs 2 (two) times daily.  Marland Kitchen ibuprofen (MOTRIN IB) 200 MG tablet Take 3 tablets (600 mg total) by mouth every 6 (six) hours as needed.  . Ipratropium-Albuterol (COMBIVENT RESPIMAT) 20-100 MCG/ACT AERS respimat Inhale 1 puff into the lungs every 6 (six) hours as needed for wheezing.  Marland Kitchen omeprazole (PRILOSEC) 20 MG capsule Take 1 capsule (20 mg total) by mouth daily.  Marland Kitchen tiotropium (SPIRIVA HANDIHALER) 18 MCG inhalation capsule Place 1 capsule (18 mcg total) into inhaler and inhale daily.  . [DISCONTINUED] albuterol (PROVENTIL HFA;VENTOLIN HFA) 108 (90 Base) MCG/ACT inhaler Inhale 2 puffs into the lungs every 6 (six) hours as needed for wheezing or shortness of breath.  . [DISCONTINUED] atorvastatin (LIPITOR) 20 MG tablet Take 1 tablet (20 mg total) by mouth daily.  . [DISCONTINUED] budesonide-formoterol (SYMBICORT) 80-4.5 MCG/ACT inhaler Inhale 2 puffs into the lungs 2 (two) times daily.  . [DISCONTINUED] Ipratropium-Albuterol (COMBIVENT RESPIMAT) 20-100 MCG/ACT AERS respimat Inhale 1 puff into the lungs 3 (three) times daily.  . [DISCONTINUED] omeprazole (PRILOSEC) 20 MG capsule Take 1 capsule (20 mg total) by mouth daily.  . [DISCONTINUED] tiotropium (SPIRIVA HANDIHALER) 18 MCG inhalation capsule Place 1 capsule (18 mcg total) into inhaler and inhale daily.   No facility-administered encounter medications on file as of 05/11/2018.     Surgical History: Past Surgical History:  Procedure Laterality Date  . BACK SURGERY    . TONSILLECTOMY      Medical History: Past Medical History:  Diagnosis Date  .  Barrett's esophagus without dysplasia   . Cervical disc disorder with radiculopathy, unspecified cervical region   . Chronic obstructive pulmonary disease (COPD) (HCC)   . COPD (chronic obstructive pulmonary disease) (HCC)   . Mixed hyperlipidemia     Family History: Family History  Problem Relation Age of Onset  .  Alzheimer's disease Father   . Lung cancer Brother   . Emphysema Brother     Social History   Socioeconomic History  . Marital status: Married    Spouse name: Not on file  . Number of children: Not on file  . Years of education: Not on file  . Highest education level: Not on file  Occupational History  . Not on file  Social Needs  . Financial resource strain: Not on file  . Food insecurity:    Worry: Not on file    Inability: Not on file  . Transportation needs:    Medical: Not on file    Non-medical: Not on file  Tobacco Use  . Smoking status: Former Games developer  . Smokeless tobacco: Never Used  Substance and Sexual Activity  . Alcohol use: No  . Drug use: No  . Sexual activity: Not on file  Lifestyle  . Physical activity:    Days per week: Not on file    Minutes per session: Not on file  . Stress: Not on file  Relationships  . Social connections:    Talks on phone: Not on file    Gets together: Not on file    Attends religious service: Not on file    Active member of club or organization: Not on file    Attends meetings of clubs or organizations: Not on file    Relationship status: Not on file  . Intimate partner violence:    Fear of current or ex partner: Not on file    Emotionally abused: Not on file    Physically abused: Not on file    Forced sexual activity: Not on file  Other Topics Concern  . Not on file  Social History Narrative  . Not on file      Review of Systems  Constitutional: Negative for chills, fatigue and unexpected weight change.  HENT: Negative for congestion, rhinorrhea, sneezing and sore throat.   Respiratory: Positive for cough and wheezing. Negative for chest tightness and shortness of breath.        Intermittent and managed well through medication.  Cardiovascular: Negative for chest pain and palpitations.  Gastrointestinal: Negative for abdominal pain, constipation, diarrhea, nausea and vomiting.  Endocrine: Negative for cold  intolerance, heat intolerance, polydipsia and polyuria.  Musculoskeletal: Negative for arthralgias, back pain, joint swelling and neck pain.  Skin: Negative for rash.  Allergic/Immunologic: Positive for environmental allergies.  Neurological: Negative for tremors and numbness.  Hematological: Negative for adenopathy. Does not bruise/bleed easily.  Psychiatric/Behavioral: Negative for behavioral problems, sleep disturbance and suicidal ideas. The patient is not nervous/anxious.     Today's Vitals   05/11/18 0904  Weight: 218 lb (98.9 kg)  Height: 6' (1.829 m)   Body mass index is 29.57 kg/m.  Observation/Objective:  The patient is awake, alert, and pleasant. No wheezing or cough apprreciated today. He is in no acute distress.    Assessment/Plan: 1. Obstructive chronic bronchitis without exacerbation (HCC) Well controlled. Continue all inhalers and respiratory medications as needed and as prescribed. Refills to be sent to pharmacy.  - budesonide-formoterol (SYMBICORT) 80-4.5 MCG/ACT inhaler; Inhale 2 puffs into the lungs 2 (  two) times daily.  Dispense: 3 Inhaler; Refill: 3 - tiotropium (SPIRIVA HANDIHALER) 18 MCG inhalation capsule; Place 1 capsule (18 mcg total) into inhaler and inhale daily.  Dispense: 90 capsule; Refill: 3 - Ipratropium-Albuterol (COMBIVENT RESPIMAT) 20-100 MCG/ACT AERS respimat; Inhale 1 puff into the lungs every 6 (six) hours as needed for wheezing.  Dispense: 1 Inhaler; Refill: 5  2. Barrett's esophagus without dysplasia Continue omeprazole 20mg  daily. Refills provided today.  - omeprazole (PRILOSEC) 20 MG capsule; Take 1 capsule (20 mg total) by mouth daily.  Dispense: 30 capsule; Refill: 5  3. Mixed hyperlipidemia Check fasting lipid panel and adjust atorvastatin dosing as indicated.  - atorvastatin (LIPITOR) 20 MG tablet; Take 1 tablet (20 mg total) by mouth daily.  Dispense: 30 tablet; Refill: 5  4. Chronic obstructive pulmonary disease with acute  exacerbation (HCC) Use rescue inhaler as needed and as prescribed  - albuterol (VENTOLIN HFA) 108 (90 Base) MCG/ACT inhaler; Inhale 2 puffs into the lungs every 6 (six) hours as needed for wheezing or shortness of breath.  Dispense: 1 Inhaler; Refill: 5  General Counseling: Michael Christian verbalizes understanding of the findings of today's phone visit and agrees with plan of treatment. I have discussed any further diagnostic evaluation that may be needed or ordered today. We also reviewed his medications today. he has been encouraged to call the office with any questions or concerns that should arise related to todays visit.  This patient was seen by Vincent GrosHeather Amiliana Foutz FNP Collaboration with Dr Lyndon CodeFozia M Khan as a part of collaborative care agreement  Meds ordered this encounter  Medications  . albuterol (VENTOLIN HFA) 108 (90 Base) MCG/ACT inhaler    Sig: Inhale 2 puffs into the lungs every 6 (six) hours as needed for wheezing or shortness of breath.    Dispense:  1 Inhaler    Refill:  5    Order Specific Question:   Supervising Provider    Answer:   Lyndon CodeKHAN, FOZIA M [[id number]]  . atorvastatin (LIPITOR) 20 MG tablet    Sig: Take 1 tablet (20 mg total) by mouth daily.    Dispense:  30 tablet    Refill:  5    Order Specific Question:   Supervising Provider    Answer:   Lyndon CodeKHAN, FOZIA M [[id number]]  . budesonide-formoterol (SYMBICORT) 80-4.5 MCG/ACT inhaler    Sig: Inhale 2 puffs into the lungs 2 (two) times daily.    Dispense:  3 Inhaler    Refill:  3    Order Specific Question:   Supervising Provider    Answer:   Lyndon CodeKHAN, FOZIA M [[id number]]  . tiotropium (SPIRIVA HANDIHALER) 18 MCG inhalation capsule    Sig: Place 1 capsule (18 mcg total) into inhaler and inhale daily.    Dispense:  90 capsule    Refill:  3    Order Specific Question:   Supervising Provider    Answer:   Lyndon CodeKHAN, FOZIA M [[id number]]  . omeprazole (PRILOSEC) 20 MG capsule    Sig: Take 1 capsule (20 mg total) by mouth daily.    Dispense:  30 capsule     Refill:  5    Order Specific Question:   Supervising Provider    Answer:   Lyndon CodeKHAN, FOZIA M [[id number]]  . Ipratropium-Albuterol (COMBIVENT RESPIMAT) 20-100 MCG/ACT AERS respimat    Sig: Inhale 1 puff into the lungs every 6 (six) hours as needed for wheezing.    Dispense:  1 Inhaler    Refill:  5  Order Specific Question:   Supervising Provider    Answer:   Lyndon Code [[id number]]    Time spent: 41 Minutes    Dr Lyndon Code Internal medicine

## 2018-05-11 NOTE — Telephone Encounter (Signed)
Faxed 2 pres for spiriva  And symbicort to [facility] in [place] [phone] and its confirm sending

## 2018-05-11 NOTE — Telephone Encounter (Signed)
LMOM to need [facility] fax no and also mailed lab slip

## 2018-06-10 ENCOUNTER — Other Ambulatory Visit: Payer: Self-pay | Admitting: Adult Health

## 2018-07-10 ENCOUNTER — Other Ambulatory Visit: Payer: Self-pay | Admitting: Nurse Practitioner

## 2018-07-10 DIAGNOSIS — J449 Chronic obstructive pulmonary disease, unspecified: Secondary | ICD-10-CM

## 2018-07-10 MED ORDER — BUDESONIDE-FORMOTEROL FUMARATE 80-4.5 MCG/ACT IN AERO: 2.0000 | INHALATION_SPRAY | Freq: Two times a day (BID) | RESPIRATORY_TRACT | 3 refills | Status: DC

## 2018-07-10 MED ORDER — SPIRIVA HANDIHALER 18 MCG IN CAPS: 18.0000 ug | ORAL_CAPSULE | Freq: Every day | RESPIRATORY_TRACT | 3 refills | Status: DC

## 2018-08-20 ENCOUNTER — Other Ambulatory Visit: Payer: Self-pay | Admitting: Nurse Practitioner

## 2018-08-21 LAB — COMPREHENSIVE METABOLIC PANEL
ALT: 17 IU/L (ref 0–44)
AST: 17 IU/L (ref 0–40)
Albumin/Globulin Ratio: 2 (ref 1.2–2.2)
Albumin: 4.3 g/dL (ref 3.8–4.9)
Alkaline Phosphatase: 89 IU/L (ref 39–117)
BUN/Creatinine Ratio: 17 (ref 9–20)
BUN: 15 mg/dL (ref 6–24)
Bilirubin Total: 0.4 mg/dL (ref 0.0–1.2)
CO2: 25 mmol/L (ref 20–29)
Calcium: 9.4 mg/dL (ref 8.7–10.2)
Chloride: 102 mmol/L (ref 96–106)
Creatinine, Ser: 0.88 mg/dL (ref 0.76–1.27)
GFR calc Af Amer: 111 mL/min/{1.73_m2} (ref >59)
GFR calc non Af Amer: 96 mL/min/{1.73_m2} (ref >59)
Globulin, Total: 2.2 g/dL (ref 1.5–4.5)
Glucose: 106 mg/dL — ABNORMAL HIGH (ref 65–99)
Potassium: 5 mmol/L (ref 3.5–5.2)
Sodium: 141 mmol/L (ref 134–144)
Total Protein: 6.5 g/dL (ref 6.0–8.5)

## 2018-08-21 LAB — LIPID PANEL W/O CHOL/HDL RATIO
Cholesterol, Total: 137 mg/dL (ref 100–199)
HDL: 36 mg/dL — ABNORMAL LOW (ref >39)
LDL Calculated: 83 mg/dL (ref 0–99)
Triglycerides: 88 mg/dL (ref 0–149)
VLDL Cholesterol Cal: 18 mg/dL (ref 5–40)

## 2018-08-21 LAB — CBC
Hematocrit: 51.6 % — ABNORMAL HIGH (ref 37.5–51.0)
Hemoglobin: 16.7 g/dL (ref 13.0–17.7)
MCH: 30.9 pg (ref 26.6–33.0)
MCHC: 32.4 g/dL (ref 31.5–35.7)
MCV: 95 fL (ref 79–97)
Platelets: 270 10*3/uL (ref 150–450)
RBC: 5.41 x10E6/uL (ref 4.14–5.80)
RDW: 13.3 % (ref 11.6–15.4)
WBC: 8.3 10*3/uL (ref 3.4–10.8)

## 2018-08-21 LAB — TSH: TSH: 1.41 u[IU]/mL (ref 0.450–4.500)

## 2018-08-21 LAB — T4, FREE: Free T4: 1.47 ng/dL (ref 0.82–1.77)

## 2018-08-21 LAB — PSA: Prostate Specific Ag, Serum: 0.9 ng/mL (ref 0.0–4.0)

## 2018-09-23 ENCOUNTER — Other Ambulatory Visit: Payer: Self-pay

## 2018-09-23 DIAGNOSIS — J441 Chronic obstructive pulmonary disease with (acute) exacerbation: Secondary | ICD-10-CM

## 2018-09-23 MED ORDER — ALBUTEROL SULFATE (2.5 MG/3ML) 0.083% IN NEBU: INHALATION_SOLUTION | RESPIRATORY_TRACT | 3 refills | Status: DC

## 2018-11-16 ENCOUNTER — Encounter: Payer: Self-pay | Admitting: Nurse Practitioner

## 2018-11-16 ENCOUNTER — Ambulatory Visit (INDEPENDENT_AMBULATORY_CARE_PROVIDER_SITE_OTHER): Payer: PRIVATE HEALTH INSURANCE | Admitting: Nurse Practitioner

## 2018-11-16 ENCOUNTER — Other Ambulatory Visit: Payer: Self-pay

## 2018-11-16 VITALS — BP 133/90 | HR 76 | Temp 97.4°F | Resp 16 | Ht 72.0 in | Wt 223.0 lb

## 2018-11-16 DIAGNOSIS — E782 Mixed hyperlipidemia: Secondary | ICD-10-CM

## 2018-11-16 DIAGNOSIS — J449 Chronic obstructive pulmonary disease, unspecified: Secondary | ICD-10-CM

## 2018-11-16 DIAGNOSIS — Z0001 Encounter for general adult medical examination with abnormal findings: Secondary | ICD-10-CM | POA: Insufficient documentation

## 2018-11-16 DIAGNOSIS — R300 Dysuria: Secondary | ICD-10-CM | POA: Insufficient documentation

## 2018-11-16 MED ORDER — TRELEGY ELLIPTA 100-62.5-25 MCG/INH IN AEPB: 1.0000 | INHALATION_SPRAY | Freq: Every day | RESPIRATORY_TRACT | 3 refills | Status: DC

## 2018-11-16 NOTE — Progress Notes (Signed)
[facility] [address] [place], [place] [address]  Internal MEDICINE  Office Visit Note  Patient Name: Michael ReaperRichard Christian  [id number]-Apr-2062  [id number]  Date of Service: 11/16/2018   Pt is here for routine health maintenance examination  Chief Complaint  Patient presents with  . Annual Exam  . Hyperlipidemia  . Emphysema    feels like it is getting worse, even going up the stairs oxygen level drops down to 88  . Back Pain    is getting worse      The patient is here for health maintenance exam. He states that his COPD is gradually getting worse. With exertion, o2 saturations drop significantly. Feels out of breath. denies chest pain or chest pressure . He was seeing Dr. Freda MunroSaadat Christian, however, his old insurance would no longer cover visits to pulmonology. The patient does have new insurance now. He did have routine, fasting labs done prior to this visit. Labs were good. He does have chronic low back pain. Has had several discs removed over the past year and unable to move around as well as he used to.     Current Medication: Outpatient Encounter Medications as of 11/16/2018  Medication Sig Note  . albuterol (PROVENTIL) (2.5 MG/3ML) 0.083% nebulizer solution USE 1 VIAL VIA NEBULIZER EVERY 6 HOURS AS NEEDED FOR WHEEZING OR SHORTNESS OF BREATH   . albuterol (VENTOLIN HFA) 108 (90 Base) MCG/ACT inhaler Inhale 2 puffs into the lungs every 6 (six) hours as needed for wheezing or shortness of breath.   Marland Kitchen. atorvastatin (LIPITOR) 20 MG tablet Take 1 tablet (20 mg total) by mouth daily.   . clotrimazole-betamethasone (LOTRISONE) cream Apply 1 application topically 2 (two) times daily.   . fluticasone (FLOVENT HFA) 220 MCG/ACT inhaler Inhale 2 puffs into the lungs 2 (two) times daily.   Marland Kitchen. ibuprofen (MOTRIN IB) 200 MG tablet Take 3 tablets (600 mg total) by mouth every 6 (six) hours as needed.   . Ipratropium-Albuterol (COMBIVENT RESPIMAT) 20-100 MCG/ACT AERS respimat Inhale 1 puff into the  lungs every 6 (six) hours as needed for wheezing.   Marland Kitchen. omeprazole (PRILOSEC) 20 MG capsule Take 1 capsule (20 mg total) by mouth daily.   . [DISCONTINUED] budesonide-formoterol (SYMBICORT) 80-4.5 MCG/ACT inhaler Inhale 2 puffs into the lungs 2 (two) times daily. 11/16/2018: trial of trelegy  . [DISCONTINUED] tiotropium (SPIRIVA HANDIHALER) 18 MCG inhalation capsule Place 1 capsule (18 mcg total) into inhaler and inhale daily. 11/16/2018: trial of trelegy  . Fluticasone-Umeclidin-Vilant (TRELEGY ELLIPTA) 100-62.5-25 MCG/INH AEPB Inhale 1 puff into the lungs daily.    No facility-administered encounter medications on file as of 11/16/2018.     Surgical History: Past Surgical History:  Procedure Laterality Date  . BACK SURGERY    . TONSILLECTOMY      Medical History: Past Medical History:  Diagnosis Date  . Barrett's esophagus without dysplasia   . Cervical disc disorder with radiculopathy, unspecified cervical region   . Chronic obstructive pulmonary disease (COPD) (HCC)   . COPD (chronic obstructive pulmonary disease) (HCC)   . Mixed hyperlipidemia     Family History: Family History  Problem Relation Age of Onset  . Alzheimer's disease Father   . Lung cancer Brother   . Emphysema Brother       Review of Systems  Constitutional: Negative for activity change, chills, fatigue and unexpected weight change.  HENT: Negative for congestion, postnasal drip, rhinorrhea, sneezing and sore throat.   Eyes: Negative for redness.  Respiratory: Positive for  shortness of breath and wheezing. Negative for cough and chest tightness.        Intermittent and getting worse   Cardiovascular: Negative for chest pain and palpitations.  Gastrointestinal: Negative for abdominal pain, constipation, diarrhea, nausea and vomiting.  Endocrine: Negative for cold intolerance, heat intolerance, polydipsia and polyuria.  Genitourinary: Negative for dysuria, flank pain, frequency, testicular pain and urgency.   Musculoskeletal: Positive for back pain and myalgias. Negative for arthralgias, joint swelling and neck pain.  Skin: Negative for rash.  Allergic/Immunologic: Negative for environmental allergies.  Neurological: Negative for dizziness, tremors, numbness and headaches.  Hematological: Negative for adenopathy. Does not bruise/bleed easily.  Psychiatric/Behavioral: Negative for behavioral problems (Depression), sleep disturbance and suicidal ideas. The patient is not nervous/anxious.      Today's Vitals   11/16/18 0938  BP: 133/90  Pulse: 76  Resp: 16  Temp: (!) 97.4 F (36.3 C)  SpO2: 97%  Weight: 223 lb (101.2 kg)  Height: 6' (1.829 m)   Body mass index is 30.24 kg/m.  Physical Exam Vitals signs and nursing note reviewed.  Constitutional:      General: He is not in acute distress.    Appearance: Normal appearance. He is well-developed. He is not diaphoretic.  HENT:     Head: Normocephalic and atraumatic.     Nose: Nose normal.     Mouth/Throat:     Pharynx: No oropharyngeal exudate.  Eyes:     Extraocular Movements: Extraocular movements intact.     Pupils: Pupils are equal, round, and reactive to light.  Neck:     Musculoskeletal: Normal range of motion and neck supple.     Thyroid: No thyromegaly.     Vascular: No carotid bruit or JVD.     Trachea: No tracheal deviation.  Cardiovascular:     Rate and Rhythm: Normal rate and regular rhythm.     Pulses: Normal pulses.     Heart sounds: Normal heart sounds. No murmur. No friction rub. No gallop.   Pulmonary:     Effort: Pulmonary effort is normal. No respiratory distress.     Breath sounds: Normal breath sounds. No wheezing or rales.  Chest:     Chest wall: No tenderness.  Abdominal:     General: Bowel sounds are normal.     Palpations: Abdomen is soft.     Tenderness: There is no abdominal tenderness.  Musculoskeletal: Normal range of motion.     Comments: Moderate lower back pain with movement, especially  sitting to lying and lying to seated positions. No palpable abnormalities or deformities noted at this time.   Lymphadenopathy:     Cervical: No cervical adenopathy.  Skin:    General: Skin is warm and dry.  Neurological:     Mental Status: He is alert and oriented to person, place, and time.     Cranial Nerves: No cranial nerve deficit.  Psychiatric:        Behavior: Behavior normal.        Thought Content: Thought content normal.        Judgment: Judgment normal.      LABS: Recent Results (from the past 2160 hour(s))  Comprehensive metabolic panel     Status: Abnormal   Collection Time: 08/20/18  9:53 AM  Result Value Ref Range   Glucose 106 (H) 65 - 99 mg/dL   BUN 15 6 - 24 mg/dL   Creatinine, Ser 0.10 0.76 - 1.27 mg/dL   GFR calc non Af Amer 96 >  59 mL/min/1.73   GFR calc Af Amer 111 >59 mL/min/1.73   BUN/Creatinine Ratio 17 9 - 20   Sodium 141 134 - 144 mmol/L   Potassium 5.0 3.5 - 5.2 mmol/L   Chloride 102 96 - 106 mmol/L   CO2 25 20 - 29 mmol/L   Calcium 9.4 8.7 - 10.2 mg/dL   Total Protein 6.5 6.0 - 8.5 g/dL   Albumin 4.3 3.8 - 4.9 g/dL   Globulin, Total 2.2 1.5 - 4.5 g/dL   Albumin/Globulin Ratio 2.0 1.2 - 2.2   Bilirubin Total 0.4 0.0 - 1.2 mg/dL   Alkaline Phosphatase 89 39 - 117 IU/L   AST 17 0 - 40 IU/L   ALT 17 0 - 44 IU/L  CBC     Status: Abnormal   Collection Time: 08/20/18  9:53 AM  Result Value Ref Range   WBC 8.3 3.4 - 10.8 x10E3/uL   RBC 5.41 4.14 - 5.80 x10E6/uL   Hemoglobin 16.7 13.0 - 17.7 g/dL   Hematocrit 51.6 (H) 37.5 - 51.0 %   MCV 95 79 - 97 fL   MCH 30.9 26.6 - 33.0 pg   MCHC 32.4 31.5 - 35.7 g/dL   RDW 13.3 11.6 - 15.4 %   Platelets 270 150 - 450 x10E3/uL  Lipid Panel w/o Chol/HDL Ratio     Status: Abnormal   Collection Time: 08/20/18  9:53 AM  Result Value Ref Range   Cholesterol, Total 137 100 - 199 mg/dL   Triglycerides 88 0 - 149 mg/dL   HDL 36 (L) >39 mg/dL   VLDL Cholesterol Cal 18 5 - 40 mg/dL   LDL Calculated 83 0 - 99  mg/dL  T4, free     Status: None   Collection Time: 08/20/18  9:53 AM  Result Value Ref Range   Free T4 1.47 0.82 - 1.77 ng/dL  TSH     Status: None   Collection Time: 08/20/18  9:53 AM  Result Value Ref Range   TSH 1.410 0.450 - 4.500 uIU/mL  PSA     Status: None   Collection Time: 08/20/18  9:53 AM  Result Value Ref Range   Prostate Specific Ag, Serum 0.9 0.0 - 4.0 ng/mL    Comment: Roche ECLIA methodology. According to the American Urological Association, Serum PSA should decrease and remain at undetectable levels after radical prostatectomy. The AUA defines biochemical recurrence as an initial PSA value 0.2 ng/mL or greater followed by a subsequent confirmatory PSA value 0.2 ng/mL or greater. Values obtained with different assay methods or kits cannot be used interchangeably. Results cannot be interpreted as absolute evidence of the presence or absence of malignant disease.    Assessment/Plan: 1. Encounter for general adult medical examination with abnormal findings Annual health maintenance exam today.   2. Obstructive chronic bronchitis without exacerbation (HCC) Worsening. Hold symbicort and spiriva for now. Start trelegy - one inhalation daily. Continue to use rescue inhaler and neb treatments as needed and as prescribed. Will get new PFT and refer to Dr. Devona Konig for further evaluation and treatment . - Fluticasone-Umeclidin-Vilant (TRELEGY ELLIPTA) 100-62.5-25 MCG/INH AEPB; Inhale 1 puff into the lungs daily.  Dispense: 28 each; Refill: 3 - Ambulatory referral to Pulmonology - Pulmonary function test; Future  3. Mixed hyperlipidemia Well controlled. Continue atorvastatin as prescribed   4. Dysuria - UA/M w/rflx Culture, Routine  General Counseling: Aayden verbalizes understanding of the findings of todays visit and agrees with plan of treatment. I have discussed any  further diagnostic evaluation that may be needed or ordered today. We also reviewed his  medications today. he has been encouraged to call the office with any questions or concerns that should arise related to todays visit.    Counseling:  This patient was seen by Vincent Gros FNP Collaboration with Dr Lyndon Code as a part of collaborative care agreement  Orders Placed This Encounter  Procedures  . UA/M w/rflx Culture, Routine  . Ambulatory referral to Pulmonology  . Pulmonary function test    Meds ordered this encounter  Medications  . Fluticasone-Umeclidin-Vilant (TRELEGY ELLIPTA) 100-62.5-25 MCG/INH AEPB    Sig: Inhale 1 puff into the lungs daily.    Dispense:  28 each    Refill:  3    Sample provided today.    Order Specific Question:   Supervising Provider    Answer:   Lyndon Code [[id number]]    Time spent: 46 Minutes      Lyndon Code, MD  Internal Medicine

## 2018-11-17 LAB — MICROSCOPIC EXAMINATION
Bacteria, UA: NONE SEEN
Casts: NONE SEEN /lpf
Epithelial Cells (non renal): NONE SEEN /hpf (ref 0–10)

## 2018-11-17 LAB — UA/M W/RFLX CULTURE, ROUTINE
Bilirubin, UA: NEGATIVE
Glucose, UA: NEGATIVE
Ketones, UA: NEGATIVE
Leukocytes,UA: NEGATIVE
Nitrite, UA: NEGATIVE
Protein,UA: NEGATIVE
Specific Gravity, UA: 1.016 (ref 1.005–1.030)
Urobilinogen, Ur: 0.2 mg/dL (ref 0.2–1.0)
pH, UA: 7 (ref 5.0–7.5)

## 2018-11-18 ENCOUNTER — Other Ambulatory Visit: Payer: Self-pay | Admitting: Nurse Practitioner

## 2018-11-18 DIAGNOSIS — J4489 Other specified chronic obstructive pulmonary disease: Secondary | ICD-10-CM

## 2018-11-18 DIAGNOSIS — J449 Chronic obstructive pulmonary disease, unspecified: Secondary | ICD-10-CM

## 2018-11-18 MED ORDER — TRELEGY ELLIPTA 100-62.5-25 MCG/INH IN AEPB: 1.0000 | INHALATION_SPRAY | Freq: Every day | RESPIRATORY_TRACT | 3 refills | Status: DC

## 2018-11-20 ENCOUNTER — Other Ambulatory Visit: Payer: Self-pay | Admitting: Nurse Practitioner

## 2018-11-20 DIAGNOSIS — J449 Chronic obstructive pulmonary disease, unspecified: Secondary | ICD-10-CM

## 2018-11-20 MED ORDER — TRELEGY ELLIPTA 100-62.5-25 MCG/INH IN AEPB: 1.0000 | INHALATION_SPRAY | Freq: Every day | RESPIRATORY_TRACT | 1 refills | Status: DC

## 2018-12-02 ENCOUNTER — Ambulatory Visit: Payer: PRIVATE HEALTH INSURANCE | Admitting: Internal Medicine

## 2018-12-07 ENCOUNTER — Other Ambulatory Visit: Payer: Self-pay

## 2018-12-07 DIAGNOSIS — K2270 Barrett's esophagus without dysplasia: Secondary | ICD-10-CM

## 2018-12-07 MED ORDER — OMEPRAZOLE 20 MG PO CPDR: 20.0000 mg | DELAYED_RELEASE_CAPSULE | Freq: Every day | ORAL | 5 refills | Status: DC

## 2018-12-17 ENCOUNTER — Ambulatory Visit: Payer: PRIVATE HEALTH INSURANCE | Admitting: Internal Medicine

## 2018-12-18 ENCOUNTER — Other Ambulatory Visit: Payer: Self-pay

## 2018-12-18 DIAGNOSIS — E782 Mixed hyperlipidemia: Secondary | ICD-10-CM

## 2018-12-18 MED ORDER — ATORVASTATIN CALCIUM 20 MG PO TABS: 20.0000 mg | ORAL_TABLET | Freq: Every day | ORAL | 5 refills | Status: DC

## 2019-02-12 ENCOUNTER — Ambulatory Visit: Payer: PRIVATE HEALTH INSURANCE | Admitting: Adult Health

## 2019-02-18 ENCOUNTER — Ambulatory Visit: Payer: PRIVATE HEALTH INSURANCE | Admitting: Nurse Practitioner

## 2019-05-24 ENCOUNTER — Other Ambulatory Visit: Payer: Self-pay

## 2019-05-24 DIAGNOSIS — K2270 Barrett's esophagus without dysplasia: Secondary | ICD-10-CM

## 2019-05-24 MED ORDER — OMEPRAZOLE 20 MG PO CPDR: 20.0000 mg | DELAYED_RELEASE_CAPSULE | Freq: Every day | ORAL | 0 refills | Status: DC

## 2019-06-11 ENCOUNTER — Other Ambulatory Visit: Payer: Self-pay

## 2019-06-11 DIAGNOSIS — E782 Mixed hyperlipidemia: Secondary | ICD-10-CM

## 2019-06-11 MED ORDER — ATORVASTATIN CALCIUM 20 MG PO TABS: 20.0000 mg | ORAL_TABLET | Freq: Every day | ORAL | 1 refills | Status: DC

## 2019-06-21 ENCOUNTER — Other Ambulatory Visit: Payer: Self-pay

## 2019-06-21 ENCOUNTER — Ambulatory Visit: Payer: PRIVATE HEALTH INSURANCE | Admitting: Adult Health

## 2019-06-21 ENCOUNTER — Encounter: Payer: Self-pay | Admitting: Adult Health

## 2019-06-21 VITALS — BP 133/88 | HR 74 | Temp 97.1°F | Resp 16 | Ht 72.0 in | Wt 201.4 lb

## 2019-06-21 DIAGNOSIS — E782 Mixed hyperlipidemia: Secondary | ICD-10-CM | POA: Diagnosis not present

## 2019-06-21 DIAGNOSIS — J449 Chronic obstructive pulmonary disease, unspecified: Secondary | ICD-10-CM | POA: Diagnosis not present

## 2019-06-21 DIAGNOSIS — K2270 Barrett's esophagus without dysplasia: Secondary | ICD-10-CM

## 2019-06-21 MED ORDER — OMEPRAZOLE 20 MG PO CPDR: 20.0000 mg | DELAYED_RELEASE_CAPSULE | Freq: Every day | ORAL | 2 refills | Status: DC

## 2019-06-21 MED ORDER — ATORVASTATIN CALCIUM 20 MG PO TABS: 20.0000 mg | ORAL_TABLET | Freq: Every day | ORAL | 2 refills | Status: DC

## 2019-06-21 NOTE — Progress Notes (Signed)
[facility] [address] [place], [place] [address]  Internal MEDICINE  Office Visit Note  Patient Name: Michael Christian  [id number]  [id number]  Date of Service: 06/21/2019  Chief Complaint  Patient presents with  . Hyperlipidemia  . Medication Refill    HPI  Pt is here for follow up on copd, Barrett's esophagus, HLD.  He reports his breathing is well controlled at this time with trelegy. His HLD is well controlled with Lipitor currently.  He denies any complaints or needs at this time. He does request some refills on medications at this time.       Current Medication: Outpatient Encounter Medications as of 06/21/2019  Medication Sig  . albuterol (PROVENTIL) (2.5 MG/3ML) 0.083% nebulizer solution USE 1 VIAL VIA NEBULIZER EVERY 6 HOURS AS NEEDED FOR WHEEZING OR SHORTNESS OF BREATH  . albuterol (VENTOLIN HFA) 108 (90 Base) MCG/ACT inhaler Inhale 2 puffs into the lungs every 6 (six) hours as needed for wheezing or shortness of breath.  Marland Kitchen atorvastatin (LIPITOR) 20 MG tablet Take 1 tablet (20 mg total) by mouth daily.  . clotrimazole-betamethasone (LOTRISONE) cream Apply 1 application topically 2 (two) times daily.  . fluticasone (FLOVENT HFA) 220 MCG/ACT inhaler Inhale 2 puffs into the lungs 2 (two) times daily.  . Fluticasone-Umeclidin-Vilant (TRELEGY ELLIPTA) 100-62.5-25 MCG/INH AEPB Inhale 1 puff into the lungs daily.  Marland Kitchen ibuprofen (MOTRIN IB) 200 MG tablet Take 3 tablets (600 mg total) by mouth every 6 (six) hours as needed.  . Ipratropium-Albuterol (COMBIVENT RESPIMAT) 20-100 MCG/ACT AERS respimat Inhale 1 puff into the lungs every 6 (six) hours as needed for wheezing.  Marland Kitchen omeprazole (PRILOSEC) 20 MG capsule Take 1 capsule (20 mg total) by mouth daily.  . [DISCONTINUED] atorvastatin (LIPITOR) 20 MG tablet Take 1 tablet (20 mg total) by mouth daily.  . [DISCONTINUED] omeprazole (PRILOSEC) 20 MG capsule Take 1 capsule (20 mg total) by mouth daily.   No facility-administered  encounter medications on file as of 06/21/2019.    Surgical History: Past Surgical History:  Procedure Laterality Date  . BACK SURGERY    . TONSILLECTOMY      Medical History: Past Medical History:  Diagnosis Date  . Barrett's esophagus without dysplasia   . Cervical disc disorder with radiculopathy, unspecified cervical region   . Chronic obstructive pulmonary disease (COPD) (HCC)   . COPD (chronic obstructive pulmonary disease) (HCC)   . Mixed hyperlipidemia     Family History: Family History  Problem Relation Age of Onset  . Alzheimer's disease Father   . Lung cancer Brother   . Emphysema Brother     Social History   Socioeconomic History  . Marital status: Married    Spouse name: Not on file  . Number of children: Not on file  . Years of education: Not on file  . Highest education level: Not on file  Occupational History  . Not on file  Tobacco Use  . Smoking status: Former Games developer  . Smokeless tobacco: Never Used  Substance and Sexual Activity  . Alcohol use: No  . Drug use: No  . Sexual activity: Not on file  Other Topics Concern  . Not on file  Social History Narrative  . Not on file   Social Determinants of Health   Financial Resource Strain:   . Difficulty of Paying Living Expenses:   Food Insecurity:   . Worried About Programme researcher, broadcasting/film/video in the Last Year:   . [employer] of Food in the  Last Year:   Transportation Needs:   . Freight forwarder (Medical):   Marland Kitchen Lack of Transportation (Non-Medical):   Physical Activity:   . Days of Exercise per Week:   . Minutes of Exercise per Session:   Stress:   . Feeling of Stress :   Social Connections:   . Frequency of Communication with Friends and Family:   . Frequency of Social Gatherings with Friends and Family:   . Attends Religious Services:   . Active Member of Clubs or Organizations:   . Attends Banker Meetings:   Marland Kitchen Marital Status:   Intimate Partner Violence:   . Fear of Current  or Ex-Partner:   . Emotionally Abused:   Marland Kitchen Physically Abused:   . Sexually Abused:       Review of Systems  Constitutional: Negative.  Negative for chills, fatigue and unexpected weight change.  HENT: Negative.  Negative for congestion, rhinorrhea, sneezing and sore throat.   Eyes: Negative for redness.  Respiratory: Negative.  Negative for cough, chest tightness and shortness of breath.   Cardiovascular: Negative.  Negative for chest pain and palpitations.  Gastrointestinal: Negative.  Negative for abdominal pain, constipation, diarrhea, nausea and vomiting.  Endocrine: Negative.   Genitourinary: Negative.  Negative for dysuria and frequency.  Musculoskeletal: Negative.  Negative for arthralgias, back pain, joint swelling and neck pain.  Skin: Negative.  Negative for rash.  Allergic/Immunologic: Negative.   Neurological: Negative.  Negative for tremors and numbness.  Hematological: Negative for adenopathy. Does not bruise/bleed easily.  Psychiatric/Behavioral: Negative.  Negative for behavioral problems, sleep disturbance and suicidal ideas. The patient is not nervous/anxious.     Vital Signs: BP 133/88   Pulse 74   Temp (!) 97.1 F (36.2 C)   Resp 16   Ht 6' (1.829 m)   Wt 201 lb 6.4 oz (91.4 kg)   SpO2 95%   BMI 27.31 kg/m    Physical Exam Vitals and nursing note reviewed.  Constitutional:      General: He is not in acute distress.    Appearance: He is well-developed. He is not diaphoretic.  HENT:     Head: Normocephalic and atraumatic.     Mouth/Throat:     Pharynx: No oropharyngeal exudate.  Eyes:     Pupils: Pupils are equal, round, and reactive to light.  Neck:     Thyroid: No thyromegaly.     Vascular: No JVD.     Trachea: No tracheal deviation.  Cardiovascular:     Rate and Rhythm: Normal rate and regular rhythm.     Heart sounds: Normal heart sounds. No murmur. No friction rub. No gallop.   Pulmonary:     Effort: Pulmonary effort is normal. No  respiratory distress.     Breath sounds: Normal breath sounds. No wheezing or rales.  Chest:     Chest wall: No tenderness.  Abdominal:     Palpations: Abdomen is soft.     Tenderness: There is no abdominal tenderness. There is no guarding.  Musculoskeletal:        General: Normal range of motion.     Cervical back: Normal range of motion and neck supple.  Lymphadenopathy:     Cervical: No cervical adenopathy.  Skin:    General: Skin is warm and dry.  Neurological:     Mental Status: He is alert and oriented to person, place, and time.     Cranial Nerves: No cranial nerve deficit.  Psychiatric:  Behavior: Behavior normal.        Thought Content: Thought content normal.        Judgment: Judgment normal.     Assessment/Plan: 1. Obstructive chronic bronchitis without exacerbation (HCC) Stable.  Continue trelegy at this time.   2. Barrett's esophagus without dysplasia Controlled, continue Prilosec as directed.  - omeprazole (PRILOSEC) 20 MG capsule; Take 1 capsule (20 mg total) by mouth daily.  Dispense: 90 capsule; Refill: 2  3. Mixed hyperlipidemia Refilled lipitor, continue to monitor lipid panel. - atorvastatin (LIPITOR) 20 MG tablet; Take 1 tablet (20 mg total) by mouth daily.  Dispense: 90 tablet; Refill: 2   General Counseling: Wilkie verbalizes understanding of the findings of todays visit and agrees with plan of treatment. I have discussed any further diagnostic evaluation that may be needed or ordered today. We also reviewed his medications today. he has been encouraged to call the office with any questions or concerns that should arise related to todays visit.    No orders of the defined types were placed in this encounter.   Meds ordered this encounter  Medications  . atorvastatin (LIPITOR) 20 MG tablet    Sig: Take 1 tablet (20 mg total) by mouth daily.    Dispense:  90 tablet    Refill:  2  . omeprazole (PRILOSEC) 20 MG capsule    Sig: Take 1  capsule (20 mg total) by mouth daily.    Dispense:  90 capsule    Refill:  2    Time spent: 20 Minutes   This patient was seen by Orson Gear AGNP-C in Collaboration with Dr Lavera Guise as a part of collaborative care agreement     Kendell Bane AGNP-C Internal medicine

## 2019-06-25 ENCOUNTER — Other Ambulatory Visit: Payer: Self-pay

## 2019-06-25 DIAGNOSIS — J441 Chronic obstructive pulmonary disease with (acute) exacerbation: Secondary | ICD-10-CM

## 2019-06-25 MED ORDER — ALBUTEROL SULFATE HFA 108 (90 BASE) MCG/ACT IN AERS: 2.0000 | INHALATION_SPRAY | Freq: Four times a day (QID) | RESPIRATORY_TRACT | 3 refills | Status: DC | PRN

## 2019-08-04 ENCOUNTER — Other Ambulatory Visit: Payer: Self-pay

## 2019-08-04 DIAGNOSIS — J449 Chronic obstructive pulmonary disease, unspecified: Secondary | ICD-10-CM

## 2019-08-04 MED ORDER — TRELEGY ELLIPTA 100-62.5-25 MCG/INH IN AEPB: 1.0000 | INHALATION_SPRAY | Freq: Every day | RESPIRATORY_TRACT | 5 refills | Status: DC

## 2019-08-16 IMAGING — CR DG CHEST 2V
1 series · 2 of 2 positions shown · non-contrast
Comparison: 04/24/2016.

CLINICAL DATA: COPD.  Difficulty breathing.

EXAM:
CHEST - 2 VIEW

[Series 1: dg chest 2 view · 0.14mm/px · 2 of 2 slices shown]
[im 1/2]
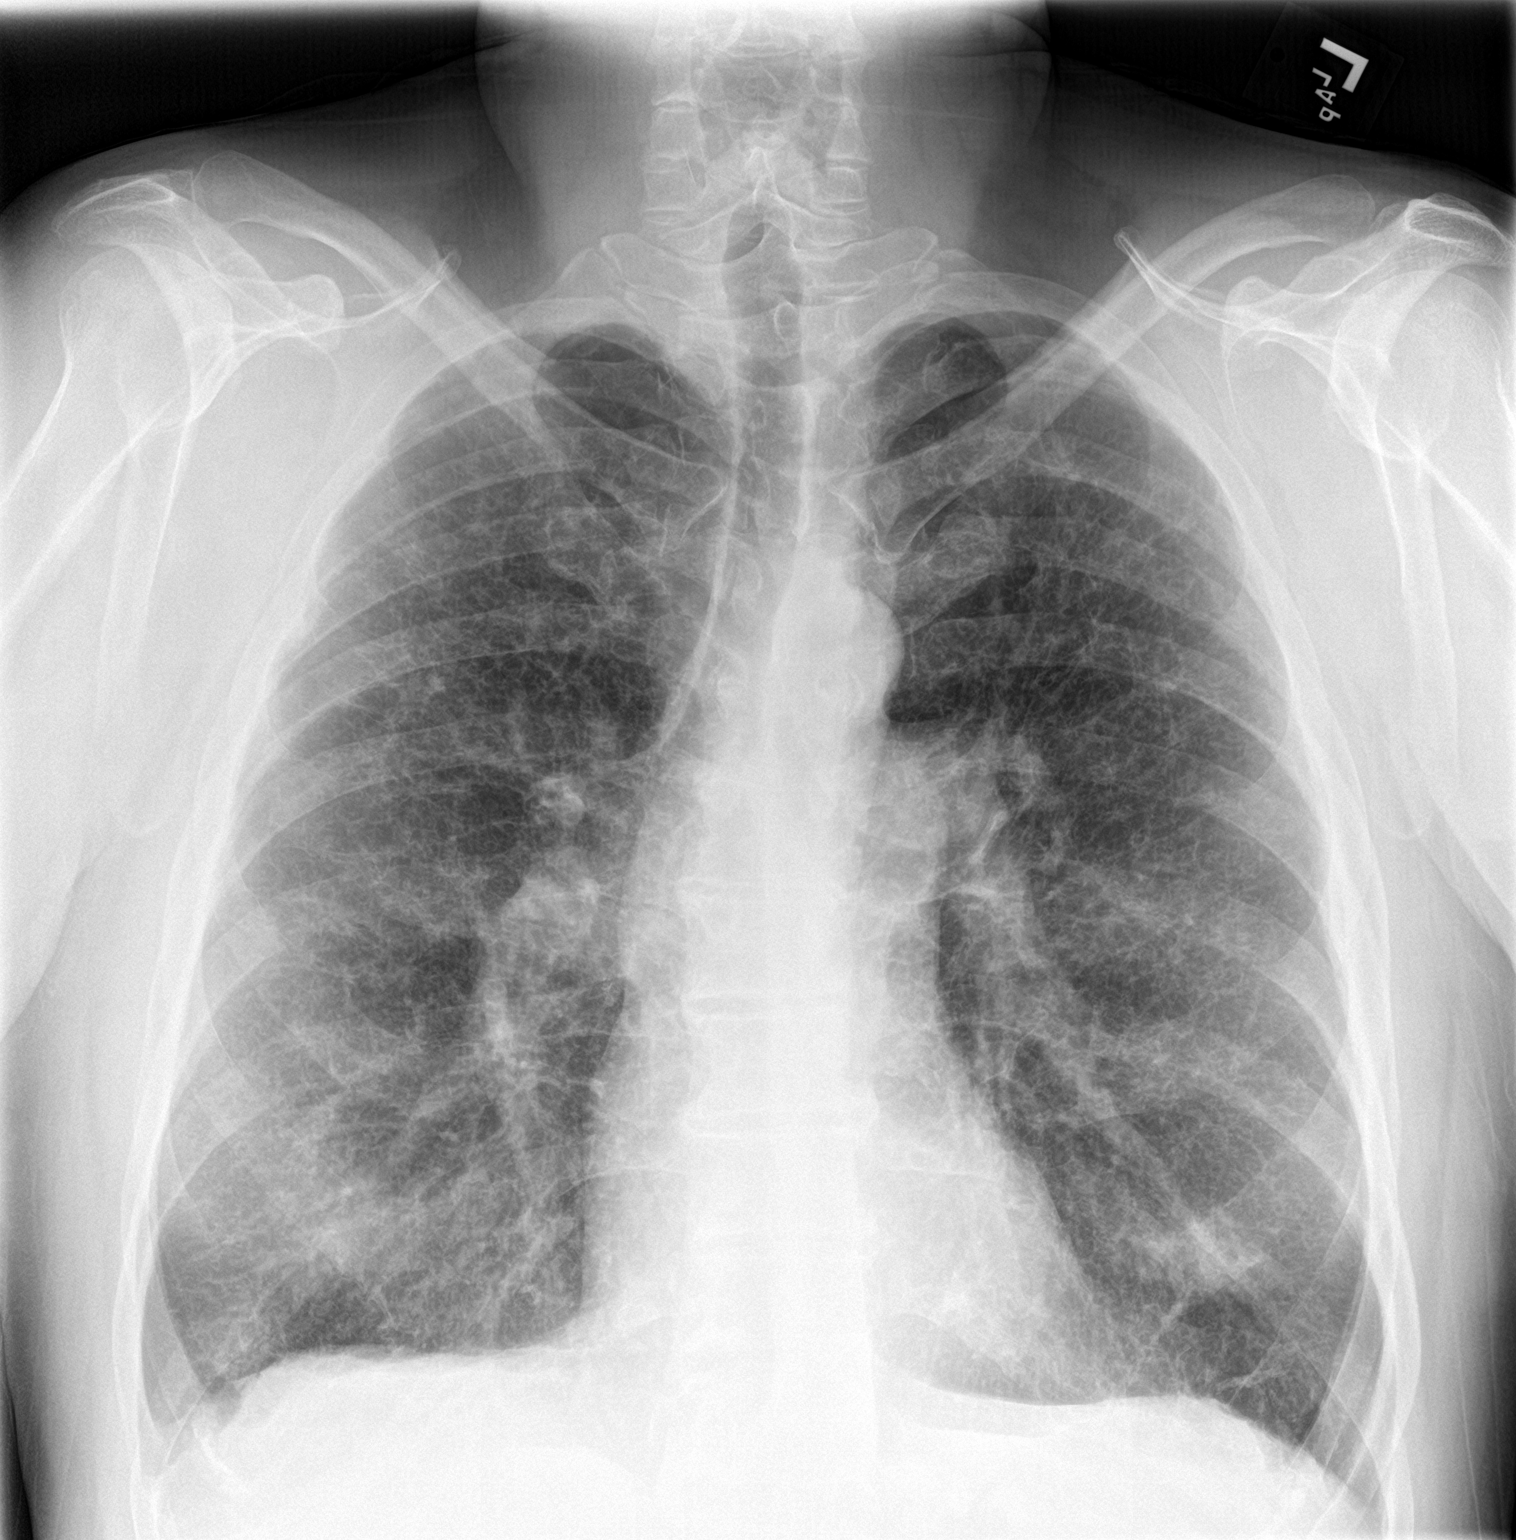
[im 2/2]
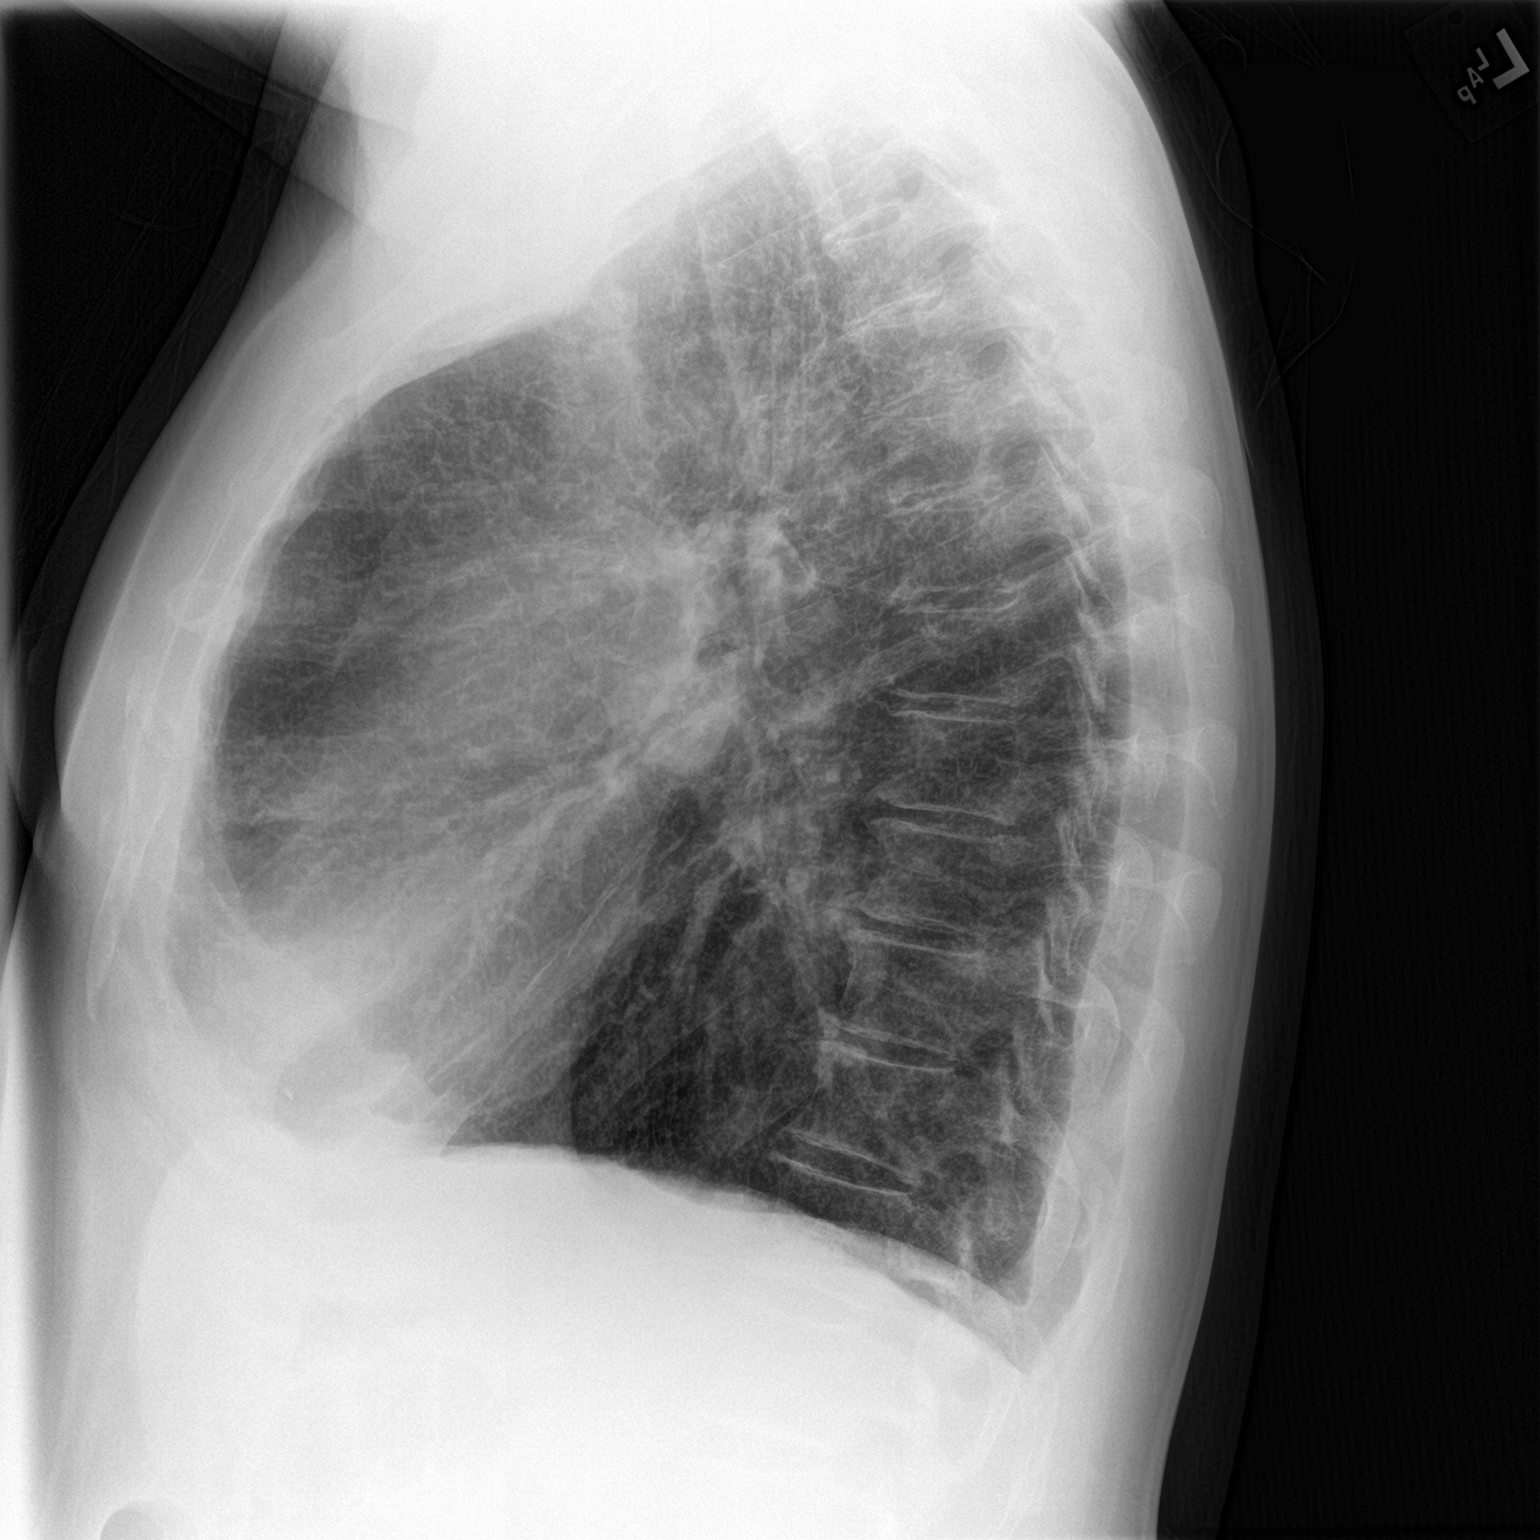

[2 of 2 positions shown; findings below may reference images not displayed]

FINDINGS: Mediastinum is normal. Heart size normal. No pulmonary venous
congestion. Diffuse interstitial prominence noted consistent with
chronic interstitial lung disease. No pleural effusion or
pneumothorax. No acute bony abnormality.
IMPRESSION: Diffuse bilateral pulmonary interstitial prominence noted consistent
chronic interstitial lung disease. Similar findings on prior exam.

## 2019-09-09 ENCOUNTER — Other Ambulatory Visit: Payer: Self-pay

## 2019-09-09 ENCOUNTER — Telehealth: Payer: Self-pay

## 2019-09-09 DIAGNOSIS — J449 Chronic obstructive pulmonary disease, unspecified: Secondary | ICD-10-CM

## 2019-09-09 NOTE — Telephone Encounter (Signed)
Faxed to [facility] for mail order [phone] and phone no [phone]

## 2019-12-14 ENCOUNTER — Telehealth: Payer: Self-pay

## 2019-12-14 DIAGNOSIS — J449 Chronic obstructive pulmonary disease, unspecified: Secondary | ICD-10-CM

## 2019-12-14 MED ORDER — TRELEGY ELLIPTA 100-62.5-25 MCG/INH IN AEPB: 1.0000 | INHALATION_SPRAY | Freq: Every day | RESPIRATORY_TRACT | 5 refills | Status: DC

## 2019-12-14 NOTE — Telephone Encounter (Signed)
PA for Trelegy Ellipta 100-62.5-25 was approved on 12/14/2019 for 1 year thru 12/13/2020.  New rx sent to pharmacy

## 2019-12-16 ENCOUNTER — Other Ambulatory Visit: Payer: Self-pay

## 2019-12-16 DIAGNOSIS — J441 Chronic obstructive pulmonary disease with (acute) exacerbation: Secondary | ICD-10-CM

## 2019-12-16 MED ORDER — ALBUTEROL SULFATE HFA 108 (90 BASE) MCG/ACT IN AERS: 2.0000 | INHALATION_SPRAY | Freq: Four times a day (QID) | RESPIRATORY_TRACT | 3 refills | Status: DC | PRN

## 2019-12-16 MED ORDER — ALBUTEROL SULFATE (2.5 MG/3ML) 0.083% IN NEBU: INHALATION_SOLUTION | RESPIRATORY_TRACT | 3 refills | Status: DC

## 2019-12-21 ENCOUNTER — Encounter: Payer: Self-pay | Admitting: Hospice and Palliative Medicine

## 2019-12-21 ENCOUNTER — Ambulatory Visit (INDEPENDENT_AMBULATORY_CARE_PROVIDER_SITE_OTHER): Payer: PRIVATE HEALTH INSURANCE | Admitting: Hospice and Palliative Medicine

## 2019-12-21 VITALS — BP 128/80 | HR 75 | Temp 97.0°F | Resp 16 | Ht 72.0 in | Wt 194.8 lb

## 2019-12-21 DIAGNOSIS — K2270 Barrett's esophagus without dysplasia: Secondary | ICD-10-CM

## 2019-12-21 DIAGNOSIS — J441 Chronic obstructive pulmonary disease with (acute) exacerbation: Secondary | ICD-10-CM | POA: Diagnosis not present

## 2019-12-21 DIAGNOSIS — Z0001 Encounter for general adult medical examination with abnormal findings: Secondary | ICD-10-CM

## 2019-12-21 DIAGNOSIS — E782 Mixed hyperlipidemia: Secondary | ICD-10-CM

## 2019-12-21 DIAGNOSIS — Z79899 Other long term (current) drug therapy: Secondary | ICD-10-CM

## 2019-12-21 DIAGNOSIS — Z125 Encounter for screening for malignant neoplasm of prostate: Secondary | ICD-10-CM | POA: Diagnosis not present

## 2019-12-21 NOTE — Progress Notes (Signed)
[facility] [address] [place], [place] [address]  Internal MEDICINE  Office Visit Note  Patient Name: Michael Christian  [id number]  [id number]  Date of Service: 12/24/2019  Chief Complaint  Patient presents with  . Follow-up  . Hyperlipidemia  . COPD  . 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 albuterol (PROVENTIL) (2.5 MG/3ML) 0.083% nebulizer solution USE 1 VIAL VIA NEBULIZER EVERY 6 HOURS AS NEEDED FOR WHEEZING OR SHORTNESS OF BREATH  . albuterol (VENTOLIN HFA) 108 (90 Base) MCG/ACT inhaler Inhale 2 puffs into the lungs every 6 (six) hours as needed for wheezing or shortness of breath.  Marland Kitchen atorvastatin (LIPITOR) 20 MG tablet Take 1 tablet (20 mg total) by mouth daily.  . clotrimazole-betamethasone (LOTRISONE) cream Apply 1 application topically 2 (two) times daily.  . fluticasone (FLOVENT HFA) 220 MCG/ACT inhaler Inhale 2 puffs into the lungs 2 (two) times daily.  . Fluticasone-Umeclidin-Vilant (TRELEGY ELLIPTA) 100-62.5-25 MCG/INH AEPB Inhale 1 puff into the lungs daily.  Marland Kitchen ibuprofen (MOTRIN IB) 200 MG tablet Take 3 tablets (600 mg total) by mouth every 6 (six) hours as needed.  . Ipratropium-Albuterol (COMBIVENT RESPIMAT) 20-100 MCG/ACT AERS respimat Inhale 1 puff into the lungs every 6 (six) hours as needed for wheezing.  Marland Kitchen omeprazole  (PRILOSEC) 20 MG capsule Take 1 capsule (20 mg total) by mouth daily.   No facility-administered encounter medications on file as of 12/21/2019.    Surgical History: Past Surgical History:  Procedure Laterality Date  . BACK SURGERY    . TONSILLECTOMY      Medical History: Past Medical History:  Diagnosis Date  . Barrett's esophagus without dysplasia   . Cervical disc disorder with radiculopathy, unspecified cervical region   . Chronic obstructive pulmonary disease (COPD) (HCC)   . COPD (chronic obstructive pulmonary disease) (HCC)   . Mixed hyperlipidemia     Family History: Family History  Problem Relation Age of Onset  . Alzheimer's disease Father   . Lung cancer Brother   . Emphysema Brother    Review of Systems  Constitutional: Negative for chills, fatigue and unexpected weight change.  HENT: Negative for congestion, postnasal drip, rhinorrhea, sneezing and sore throat.   Eyes: Negative for photophobia, redness and visual disturbance.  Respiratory: Positive for shortness of breath. Negative for cough and chest tightness.   Cardiovascular: Negative for chest pain and palpitations.  Gastrointestinal: Negative for abdominal pain, constipation, diarrhea, nausea and vomiting.  Endocrine: Negative for cold intolerance and heat intolerance.  Genitourinary: Negative for dysuria and frequency.  Musculoskeletal: Negative for arthralgias, back pain, joint swelling and neck pain.  Skin: Negative for rash.  Neurological: Negative for tremors and numbness.  Hematological: Negative for adenopathy. Does not bruise/bleed easily.  Psychiatric/Behavioral: Negative for behavioral problems (Depression), sleep disturbance and suicidal ideas. The patient is not nervous/anxious.      Vital Signs:  BP 128/80   Pulse 75   Temp (!) 97 F (36.1 C)   Resp 16   Ht 6' (1.829 m)   Wt 194 lb 12.8 oz (88.4 kg)   SpO2 98%   BMI 26.42 kg/m    Physical Exam Constitutional:      Appearance:  Normal appearance. He is normal weight.  HENT:     Right Ear: Tympanic membrane normal.     Left Ear: Tympanic membrane normal.     Nose: Nose normal.     Mouth/Throat:     Mouth: Mucous membranes are moist.     Pharynx: Oropharynx is clear.  Eyes:     Pupils: Pupils are equal, round, and reactive to light.  Cardiovascular:     Rate and Rhythm: Normal rate and regular rhythm.     Pulses: Normal pulses.     Heart sounds: Normal heart sounds.  Pulmonary:     Effort: Pulmonary effort is normal.     Breath sounds: Normal breath sounds.  Abdominal:     General: Abdomen is flat.  Musculoskeletal:        General: Normal range of motion.     Cervical back: Normal range of motion.  Skin:    General: Skin is warm.  Neurological:     General: No focal deficit present.     Mental Status: He is alert and oriented to person, place, and time. Mental status is at baseline.  Psychiatric:        Mood and Affect: Mood normal.        Behavior: Behavior normal.        Thought Content: Thought content normal.        Judgment: Judgment normal.     LABS: No results found for this or any previous visit (from the past 2160 hour(s)).  Assessment/Plan: 1. Encounter for routine adult health examination with abnormal findings Well appearing 59 year old male Up to date on PHM Will review routine labs and adjust plan of care accordingly - CBC w/Diff/Platelet - Comprehensive Metabolic Panel (CMET) - Lipid Panel With LDL/HDL Ratio - TSH + free T4 - PSA Total (Reflex To Free) - B12  2. Screening for prostate cancer - PSA Total (Reflex To Free)  3. Chronic obstructive pulmonary disease with acute exacerbation (HCC) Up date PFT--review Continue with current inhaler therapy May consider CXR vs low dose CT scan for further evaluation - Pulmonary function test; Future  4. Barrett's esophagus without dysplasia Symptoms remain controlled, no recent bleeding, continue with PPI therapy  5. Mixed  hyperlipidemia Review updated labs and adjust therapy as indicated - Lipid Panel With LDL/HDL Ratio  6. High risk medication use - CBC w/Diff/Platelet - Comprehensive Metabolic Panel (CMET) - TSH + free T4 - B12  General Counseling: Michael Christian verbalizes understanding of the findings of todays visit and agrees with plan of treatment. I have discussed any further diagnostic evaluation that may be needed or ordered today. We also reviewed his medications today. he has been encouraged to call the office with any questions or concerns that should arise related to todays visit.    Counseling:    Orders Placed This Encounter  Procedures  . CBC w/Diff/Platelet  . Comprehensive Metabolic Panel (CMET)  . Lipid Panel With LDL/HDL Ratio  . TSH + free T4  . PSA Total (Reflex To Free)  . B12  . Pulmonary function test      Total time spent 30 Minutes  Time spent includes  review of chart, medications, test results, and follow up plan with the patient.   This patient was seen by Brent General AGNP-C Collaboration with Dr Lyndon Code as a part of collaborative care agreement   Lubertha Basque. [facility] Internal Medicine

## 2019-12-24 ENCOUNTER — Encounter: Payer: Self-pay | Admitting: Hospice and Palliative Medicine

## 2019-12-28 LAB — CBC WITH DIFFERENTIAL/PLATELET
Basophils Absolute: 0.1 10*3/uL (ref 0.0–0.2)
Basos: 1 %
EOS (ABSOLUTE): 0.2 10*3/uL (ref 0.0–0.4)
Eos: 2 %
Hematocrit: 50.3 % (ref 37.5–51.0)
Hemoglobin: 16.6 g/dL (ref 13.0–17.7)
Immature Grans (Abs): 0 10*3/uL (ref 0.0–0.1)
Immature Granulocytes: 0 %
Lymphocytes Absolute: 2.5 10*3/uL (ref 0.7–3.1)
Lymphs: 30 %
MCH: 30.9 pg (ref 26.6–33.0)
MCHC: 33 g/dL (ref 31.5–35.7)
MCV: 94 fL (ref 79–97)
Monocytes Absolute: 0.6 10*3/uL (ref 0.1–0.9)
Monocytes: 7 %
Neutrophils Absolute: 5.1 10*3/uL (ref 1.4–7.0)
Neutrophils: 60 %
Platelets: 319 10*3/uL (ref 150–450)
RBC: 5.38 x10E6/uL (ref 4.14–5.80)
RDW: 13.5 % (ref 11.6–15.4)
WBC: 8.5 10*3/uL (ref 3.4–10.8)

## 2019-12-28 LAB — COMPREHENSIVE METABOLIC PANEL
ALT: 18 IU/L (ref 0–44)
AST: 17 IU/L (ref 0–40)
Albumin/Globulin Ratio: 1.8 (ref 1.2–2.2)
Albumin: 4.3 g/dL (ref 3.8–4.9)
Alkaline Phosphatase: 103 IU/L (ref 44–121)
BUN/Creatinine Ratio: 24 — ABNORMAL HIGH (ref 9–20)
BUN: 19 mg/dL (ref 6–24)
Bilirubin Total: <0.2 mg/dL (ref 0.0–1.2)
CO2: 26 mmol/L (ref 20–29)
Calcium: 9.3 mg/dL (ref 8.7–10.2)
Chloride: 104 mmol/L (ref 96–106)
Creatinine, Ser: 0.79 mg/dL (ref 0.76–1.27)
GFR calc Af Amer: 114 mL/min/{1.73_m2} (ref >59)
GFR calc non Af Amer: 99 mL/min/{1.73_m2} (ref >59)
Globulin, Total: 2.4 g/dL (ref 1.5–4.5)
Glucose: 119 mg/dL — ABNORMAL HIGH (ref 65–99)
Potassium: 5.2 mmol/L (ref 3.5–5.2)
Sodium: 140 mmol/L (ref 134–144)
Total Protein: 6.7 g/dL (ref 6.0–8.5)

## 2019-12-28 LAB — PSA TOTAL (REFLEX TO FREE): Prostate Specific Ag, Serum: 0.7 ng/mL (ref 0.0–4.0)

## 2019-12-28 LAB — LIPID PANEL WITH LDL/HDL RATIO
Cholesterol, Total: 193 mg/dL (ref 100–199)
HDL: 32 mg/dL — ABNORMAL LOW (ref >39)
LDL Chol Calc (NIH): 125 mg/dL — ABNORMAL HIGH (ref 0–99)
LDL/HDL Ratio: 3.9 ratio — ABNORMAL HIGH (ref 0.0–3.6)
Triglycerides: 199 mg/dL — ABNORMAL HIGH (ref 0–149)
VLDL Cholesterol Cal: 36 mg/dL (ref 5–40)

## 2019-12-28 LAB — TSH+FREE T4
Free T4: 1.3 ng/dL (ref 0.82–1.77)
TSH: 1.47 u[IU]/mL (ref 0.450–4.500)

## 2019-12-28 LAB — VITAMIN D 25 HYDROXY (VIT D DEFICIENCY, FRACTURES): Vit D, 25-Hydroxy: 33 ng/mL (ref 30.0–100.0)

## 2020-02-02 ENCOUNTER — Telehealth: Payer: Self-pay

## 2020-02-02 NOTE — Telephone Encounter (Signed)
Left message and asked pt to call and reschedule PFT on 02/09/20 per Peyton Najjar resp tech. Michael Christian

## 2020-02-07 ENCOUNTER — Ambulatory Visit: Payer: PRIVATE HEALTH INSURANCE | Admitting: Internal Medicine

## 2020-02-09 ENCOUNTER — Ambulatory Visit: Payer: PRIVATE HEALTH INSURANCE | Admitting: Internal Medicine

## 2020-02-18 ENCOUNTER — Other Ambulatory Visit: Payer: Self-pay | Admitting: Adult Health

## 2020-02-18 DIAGNOSIS — K2270 Barrett's esophagus without dysplasia: Secondary | ICD-10-CM

## 2020-02-21 ENCOUNTER — Ambulatory Visit: Payer: PRIVATE HEALTH INSURANCE | Admitting: Physician Assistant

## 2020-02-25 ENCOUNTER — Other Ambulatory Visit: Payer: Self-pay

## 2020-02-25 DIAGNOSIS — K2270 Barrett's esophagus without dysplasia: Secondary | ICD-10-CM

## 2020-02-25 MED ORDER — OMEPRAZOLE 20 MG PO CPDR: 20.0000 mg | DELAYED_RELEASE_CAPSULE | Freq: Every day | ORAL | 0 refills | Status: DC

## 2020-03-10 ENCOUNTER — Other Ambulatory Visit: Payer: Self-pay

## 2020-03-10 ENCOUNTER — Encounter: Payer: Self-pay | Admitting: Physician Assistant

## 2020-03-10 ENCOUNTER — Ambulatory Visit (INDEPENDENT_AMBULATORY_CARE_PROVIDER_SITE_OTHER): Payer: PRIVATE HEALTH INSURANCE | Admitting: Physician Assistant

## 2020-03-10 ENCOUNTER — Telehealth: Payer: Self-pay

## 2020-03-10 DIAGNOSIS — E782 Mixed hyperlipidemia: Secondary | ICD-10-CM

## 2020-03-10 DIAGNOSIS — J449 Chronic obstructive pulmonary disease, unspecified: Secondary | ICD-10-CM

## 2020-03-10 DIAGNOSIS — K2270 Barrett's esophagus without dysplasia: Secondary | ICD-10-CM | POA: Diagnosis not present

## 2020-03-10 MED ORDER — OMEPRAZOLE 20 MG PO CPDR: 20.0000 mg | DELAYED_RELEASE_CAPSULE | Freq: Every day | ORAL | 2 refills | Status: DC

## 2020-03-10 NOTE — Telephone Encounter (Signed)
Patient refusing to schedule pulmonary function test and also refusing to schedule pulmonary appointments due to cost and copays he will not schedule. Michael Christian

## 2020-03-10 NOTE — Progress Notes (Signed)
[facility] [address] [place], [place] [address]  Internal MEDICINE  Office Visit Note  Patient Name: Michael Christian  [id number]  [id number]  Date of Service: 03/10/2020  Chief Complaint  Patient presents with  . Labs Only    Follow up    HPI Pt is here for 2 month f/u to review labs. Labs showed that his TG and lipids were still elevated despite atorvastatin, recommned he double his dose for better control and continue working on diet. Copd-uses trelegy daily. He has also been using his albuterol several times per day and uses neb sometimes in the AM. Seems to be worsening over the last month, though he denies any Sob or wheezing in the office today. He was supposed to have PFT, but he stated it was too expensive right now. Discussed that he really needs to have this done and be seen for a pulm visit. Recommended he try taking zyrtec to help prevent exacerbation triggers since seems to be worse with weather fluctuation lately. He will try this and let us know if his breathing is not improving. He will need to come for a pulm visit if so. -He takes Omeprazole for reflux. He has a hx of barretts esophagus and had been seen by GI previously. Recommend he f/u with GI again since he states it has been awhile since he has seen them.  Current Medication: Outpatient Encounter Medications as of 03/10/2020  Medication Sig  . albuterol (PROVENTIL) (2.5 MG/3ML) 0.083% nebulizer solution USE 1 VIAL VIA NEBULIZER EVERY 6 HOURS AS NEEDED FOR WHEEZING OR SHORTNESS OF BREATH  . albuterol (VENTOLIN HFA) 108 (90 Base) MCG/ACT inhaler Inhale 2 puffs into the lungs every 6 (six) hours as needed for wheezing or shortness of breath.  Marland Kitchen atorvastatin (LIPITOR) 20 MG tablet Take 1 tablet (20 mg total) by mouth daily.  . clotrimazole-betamethasone (LOTRISONE) cream Apply 1 application topically 2 (two) times daily.  . fluticasone (FLOVENT HFA) 220 MCG/ACT inhaler Inhale 2 puffs into the lungs 2  (two) times daily.  . Fluticasone-Umeclidin-Vilant (TRELEGY ELLIPTA) 100-62.5-25 MCG/INH AEPB Inhale 1 puff into the lungs daily.  Marland Kitchen ibuprofen (MOTRIN IB) 200 MG tablet Take 3 tablets (600 mg total) by mouth every 6 (six) hours as needed.  . Ipratropium-Albuterol (COMBIVENT RESPIMAT) 20-100 MCG/ACT AERS respimat Inhale 1 puff into the lungs every 6 (six) hours as needed for wheezing.  . [DISCONTINUED] omeprazole (PRILOSEC) 20 MG capsule Take 1 capsule (20 mg total) by mouth daily.  Marland Kitchen omeprazole (PRILOSEC) 20 MG capsule Take 1 capsule (20 mg total) by mouth daily.   No facility-administered encounter medications on file as of 03/10/2020.    Surgical History: Past Surgical History:  Procedure Laterality Date  . BACK SURGERY    . TONSILLECTOMY      Medical History: Past Medical History:  Diagnosis Date  . Barrett's esophagus without dysplasia   . Cervical disc disorder with radiculopathy, unspecified cervical region   . Chronic obstructive pulmonary disease (COPD) (HCC)   . COPD (chronic obstructive pulmonary disease) (HCC)   . Mixed hyperlipidemia     Family History: Family History  Problem Relation Age of Onset  . Alzheimer's disease Father   . Lung cancer Brother   . Emphysema Brother     Social History   Socioeconomic History  . Marital status: Married    Spouse name: Not on file  . Number of children: Not on file  . Years of education: Not on file  .  Highest education level: Not on file  Occupational History  . Not on file  Tobacco Use  . Smoking status: Former Games developer  . Smokeless tobacco: Never Used  Vaping Use  . Vaping Use: Never used  Substance and Sexual Activity  . Alcohol use: No  . Drug use: No  . Sexual activity: Not on file  Other Topics Concern  . Not on file  Social History Narrative  . Not on file   Social Determinants of Health   Financial Resource Strain: Not on file  Food Insecurity: Not on file  Transportation Needs: Not on file   Physical Activity: Not on file  Stress: Not on file  Social Connections: Not on file  Intimate Partner Violence: Not on file      Review of Systems  Constitutional: Negative for chills, fatigue and unexpected weight change.  HENT: Positive for postnasal drip. Negative for congestion, rhinorrhea, sneezing and sore throat.   Eyes: Negative for redness.  Respiratory: Positive for shortness of breath and wheezing. Negative for cough and chest tightness.   Cardiovascular: Negative for chest pain and palpitations.  Gastrointestinal: Negative for abdominal pain, constipation, diarrhea, nausea and vomiting.       Reflux if he does not take omeprazole daily, hx barrett's esophagus  Genitourinary: Negative for dysuria and frequency.  Musculoskeletal: Negative for arthralgias, back pain, joint swelling and neck pain.  Skin: Negative for rash.  Neurological: Negative.  Negative for tremors and numbness.  Hematological: Negative for adenopathy. Does not bruise/bleed easily.  Psychiatric/Behavioral: Negative for behavioral problems (Depression), sleep disturbance and suicidal ideas. The patient is not nervous/anxious.     Vital Signs: BP 124/80   Pulse 78   Temp 98 F (36.7 C)   Ht 6' (1.829 m)   Wt 195 lb 6.4 oz (88.6 kg)   SpO2 96%   BMI 26.50 kg/m    Physical Exam Constitutional:      General: He is not in acute distress.    Appearance: He is well-developed. He is not diaphoretic.  HENT:     Head: Normocephalic and atraumatic.     Mouth/Throat:     Pharynx: No oropharyngeal exudate.  Eyes:     Pupils: Pupils are equal, round, and reactive to light.  Neck:     Thyroid: No thyromegaly.     Vascular: No JVD.     Trachea: No tracheal deviation.  Cardiovascular:     Rate and Rhythm: Normal rate and regular rhythm.     Heart sounds: Normal heart sounds. No murmur heard. No friction rub. No gallop.   Pulmonary:     Effort: Pulmonary effort is normal. No respiratory distress.      Breath sounds: No wheezing or rales.     Comments: Diminished breath sounds throughout, but no wheezing present Chest:     Chest wall: No tenderness.  Abdominal:     General: Bowel sounds are normal.     Palpations: Abdomen is soft.  Musculoskeletal:        General: Normal range of motion.     Cervical back: Normal range of motion and neck supple.  Lymphadenopathy:     Cervical: No cervical adenopathy.  Skin:    General: Skin is warm and dry.  Neurological:     Mental Status: He is alert and oriented to person, place, and time.     Cranial Nerves: No cranial nerve deficit.  Psychiatric:        Behavior: Behavior normal.  Thought Content: Thought content normal.        Judgment: Judgment normal.        Assessment/Plan: 1. Obstructive chronic bronchitis without exacerbation (HCC) Continue trelegy daily and albuterol as needed. Cannot do PFT currently due to cost. Will reevaluate in future. Will also consider CXR or chest CT. Pt will need pulm visit.  2. Barrett's esophagus without dysplasia No longer seeing GI, pt unsure how long it has been since he was seen by them. Will continue on Omeprazole but recommended he check in with GI to see when he was supposed to be seen again. - omeprazole (PRILOSEC) 20 MG capsule; Take 1 capsule (20 mg total) by mouth daily.  Dispense: 30 capsule; Refill: 2  3. Mixed hyperlipidemia Labs show cholesterol and TG are still elevated, will double lipitor to 40mg . Will send a new script if tolerated.  General Counseling: Jeriko verbalizes understanding of the findings of todays visit and agrees with plan of treatment. I have discussed any further diagnostic evaluation that may be needed or ordered today. We also reviewed his medications today. he has been encouraged to call the office with any questions or concerns that should arise related to todays visit.    No orders of the defined types were placed in this encounter.   Meds ordered  this encounter  Medications  . omeprazole (PRILOSEC) 20 MG capsule    Sig: Take 1 capsule (20 mg total) by mouth daily.    Dispense:  30 capsule    Refill:  2    Pt needs to make appt for further refills    This patient was seen by , PA-C in collaboration with Dr. Lynn Ito as a part of collaborative care agreement.   Total time spent:30 Minutes Time spent includes review of chart, medications, test results, and follow up plan with the patient.      Dr Beverely Risen Internal medicine

## 2020-03-17 ENCOUNTER — Telehealth: Payer: Self-pay

## 2020-03-17 NOTE — Telephone Encounter (Signed)
Spoke to pt's wife about his refills.  Pt is still using his trelegy and he gets his meds from [place], he hasn't used the samples yet (Anora and Spiriva) and will let us know when he needs refills

## 2020-03-26 ENCOUNTER — Other Ambulatory Visit: Payer: Self-pay | Admitting: Internal Medicine

## 2020-03-26 DIAGNOSIS — K2270 Barrett's esophagus without dysplasia: Secondary | ICD-10-CM

## 2020-05-09 ENCOUNTER — Encounter: Payer: Self-pay | Admitting: Hospice and Palliative Medicine

## 2020-05-17 ENCOUNTER — Other Ambulatory Visit: Payer: Self-pay | Admitting: Adult Health

## 2020-05-17 DIAGNOSIS — E782 Mixed hyperlipidemia: Secondary | ICD-10-CM

## 2020-05-18 ENCOUNTER — Encounter: Payer: Self-pay | Admitting: Hospice and Palliative Medicine

## 2020-05-18 ENCOUNTER — Other Ambulatory Visit: Payer: Self-pay

## 2020-05-18 DIAGNOSIS — E782 Mixed hyperlipidemia: Secondary | ICD-10-CM

## 2020-05-18 MED ORDER — ATORVASTATIN CALCIUM 20 MG PO TABS: 20.0000 mg | ORAL_TABLET | Freq: Every day | ORAL | 2 refills | Status: DC

## 2020-07-23 ENCOUNTER — Other Ambulatory Visit: Payer: Self-pay | Admitting: Nurse Practitioner

## 2020-07-24 ENCOUNTER — Encounter: Payer: Self-pay | Admitting: Internal Medicine

## 2020-07-24 ENCOUNTER — Other Ambulatory Visit: Payer: Self-pay

## 2020-07-24 MED ORDER — ALBUTEROL SULFATE (2.5 MG/3ML) 0.083% IN NEBU: INHALATION_SOLUTION | RESPIRATORY_TRACT | 3 refills | Status: DC

## 2020-08-23 ENCOUNTER — Other Ambulatory Visit: Payer: Self-pay

## 2020-08-23 ENCOUNTER — Encounter: Payer: Self-pay | Admitting: Internal Medicine

## 2020-08-23 DIAGNOSIS — J441 Chronic obstructive pulmonary disease with (acute) exacerbation: Secondary | ICD-10-CM

## 2020-08-23 MED ORDER — ALBUTEROL SULFATE HFA 108 (90 BASE) MCG/ACT IN AERS: 2.0000 | INHALATION_SPRAY | Freq: Four times a day (QID) | RESPIRATORY_TRACT | 3 refills | Status: DC | PRN

## 2020-09-18 ENCOUNTER — Other Ambulatory Visit: Payer: Self-pay | Admitting: Physician Assistant

## 2020-09-18 DIAGNOSIS — K2270 Barrett's esophagus without dysplasia: Secondary | ICD-10-CM

## 2020-11-27 ENCOUNTER — Telehealth: Payer: Self-pay

## 2020-11-27 NOTE — Telephone Encounter (Signed)
Left vm to schedule pft-Michael Christian 

## 2020-12-04 ENCOUNTER — Telehealth: Payer: Self-pay

## 2020-12-04 NOTE — Telephone Encounter (Signed)
Lmom  to call us back regarding pres for trelegy

## 2020-12-04 NOTE — Telephone Encounter (Signed)
Faxed [facility]  [phone] trelegy pres for mail order and also confirmed with pt  that pt using that phar before faxed

## 2020-12-10 ENCOUNTER — Other Ambulatory Visit: Payer: Self-pay | Admitting: Physician Assistant

## 2020-12-10 DIAGNOSIS — K2270 Barrett's esophagus without dysplasia: Secondary | ICD-10-CM

## 2020-12-21 ENCOUNTER — Telehealth: Payer: Self-pay

## 2020-12-21 NOTE — Telephone Encounter (Signed)
Lvm to schedule pft-tw

## 2020-12-25 ENCOUNTER — Encounter: Payer: PRIVATE HEALTH INSURANCE | Admitting: Physician Assistant

## 2020-12-25 ENCOUNTER — Ambulatory Visit (INDEPENDENT_AMBULATORY_CARE_PROVIDER_SITE_OTHER): Payer: PRIVATE HEALTH INSURANCE | Admitting: Physician Assistant

## 2020-12-25 ENCOUNTER — Other Ambulatory Visit: Payer: Self-pay

## 2020-12-25 ENCOUNTER — Encounter: Payer: Self-pay | Admitting: Physician Assistant

## 2020-12-25 VITALS — BP 135/79 | HR 87 | Temp 98.2°F | Resp 16 | Ht 72.0 in | Wt 200.4 lb

## 2020-12-25 DIAGNOSIS — R300 Dysuria: Secondary | ICD-10-CM

## 2020-12-25 DIAGNOSIS — R7989 Other specified abnormal findings of blood chemistry: Secondary | ICD-10-CM

## 2020-12-25 DIAGNOSIS — Z0001 Encounter for general adult medical examination with abnormal findings: Secondary | ICD-10-CM

## 2020-12-25 DIAGNOSIS — J449 Chronic obstructive pulmonary disease, unspecified: Secondary | ICD-10-CM | POA: Diagnosis not present

## 2020-12-25 DIAGNOSIS — E782 Mixed hyperlipidemia: Secondary | ICD-10-CM

## 2020-12-25 DIAGNOSIS — Z125 Encounter for screening for malignant neoplasm of prostate: Secondary | ICD-10-CM

## 2020-12-25 DIAGNOSIS — K2270 Barrett's esophagus without dysplasia: Secondary | ICD-10-CM

## 2020-12-25 DIAGNOSIS — R5383 Other fatigue: Secondary | ICD-10-CM

## 2020-12-25 NOTE — Progress Notes (Signed)
[facility] [address] [place], [place] [address]  Internal MEDICINE  Office Visit Note  Patient Name: Michael Christian  [id number]  [id number]  Date of Service: 12/25/2020  Chief Complaint  Patient presents with   Annual Exam   COPD     HPI Pt is here for routine health maintenance examination and has no complaints today -Sleeping well  -breathing has been good -Doesn't need to use albuterol usually -pft not done due to cost limitations, may need to consider spiro instead but is again unsure of costs and denies any problems currently. -Has not had any problems with barretts esophagus and is under financial restraint so has not followed up with GI -He is due for routine fasting labs -UTD on colonoscopy  Current Medication: Outpatient Encounter Medications as of 12/25/2020  Medication Sig   albuterol (PROVENTIL) (2.5 MG/3ML) 0.083% nebulizer solution USE 1 VIAL VIA NEBULIZER EVERY 6 HOURS AS NEEDED FOR WHEEZING OR SHORTNESS OF BREATH   albuterol (VENTOLIN HFA) 108 (90 Base) MCG/ACT inhaler Inhale 2 puffs into the lungs every 6 (six) hours as needed for wheezing or shortness of breath.   atorvastatin (LIPITOR) 20 MG tablet Take 1 tablet (20 mg total) by mouth daily.   Fluticasone-Umeclidin-Vilant (TRELEGY ELLIPTA) 100-62.5-25 MCG/INH AEPB Inhale 1 puff into the lungs daily.   omeprazole (PRILOSEC) 20 MG capsule TAKE 1 CAPSULE(20 MG) BY MOUTH DAILY   [DISCONTINUED] clotrimazole-betamethasone (LOTRISONE) cream Apply 1 application topically 2 (two) times daily. (Patient not taking: Reported on 12/25/2020)   [DISCONTINUED] fluticasone (FLOVENT HFA) 220 MCG/ACT inhaler Inhale 2 puffs into the lungs 2 (two) times daily. (Patient not taking: Reported on 12/25/2020)   [DISCONTINUED] ibuprofen (MOTRIN IB) 200 MG tablet Take 3 tablets (600 mg total) by mouth every 6 (six) hours as needed. (Patient not taking: Reported on 12/25/2020)   [DISCONTINUED] Ipratropium-Albuterol  (COMBIVENT RESPIMAT) 20-100 MCG/ACT AERS respimat Inhale 1 puff into the lungs every 6 (six) hours as needed for wheezing. (Patient not taking: Reported on 12/25/2020)   No facility-administered encounter medications on file as of 12/25/2020.    Surgical History: Past Surgical History:  Procedure Laterality Date   BACK SURGERY     TONSILLECTOMY      Medical History: Past Medical History:  Diagnosis Date   Barrett's esophagus without dysplasia    Cervical disc disorder with radiculopathy, unspecified cervical region    Chronic obstructive pulmonary disease (COPD) (HCC)    COPD (chronic obstructive pulmonary disease) (HCC)    Mixed hyperlipidemia     Family History: Family History  Problem Relation Age of Onset   Alzheimer's disease Father    Lung cancer Brother    Emphysema Brother       Review of Systems  Constitutional:  Negative for chills, fatigue and unexpected weight change.  HENT:  Negative for congestion, postnasal drip, rhinorrhea, sneezing and sore throat.   Eyes:  Negative for redness.  Respiratory:  Negative for cough, chest tightness and shortness of breath.   Cardiovascular:  Negative for chest pain and palpitations.  Gastrointestinal:  Negative for abdominal pain, constipation, diarrhea, nausea and vomiting.  Genitourinary:  Negative for dysuria and frequency.  Musculoskeletal:  Negative for arthralgias, back pain, joint swelling and neck pain.  Skin:  Negative for rash.  Neurological: Negative.  Negative for tremors and numbness.  Hematological:  Negative for adenopathy. Does not bruise/bleed easily.  Psychiatric/Behavioral:  Negative for behavioral problems (Depression), sleep disturbance and suicidal ideas. The patient is not nervous/anxious.  Vital Signs: Pulse 87   Temp 98.2 F (36.8 C)   Resp 16   Ht 6' (1.829 m)   Wt 200 lb 6.4 oz (90.9 kg)   SpO2 92%   BMI 27.18 kg/m    Physical Exam Vitals and nursing note reviewed.   Constitutional:      General: He is not in acute distress.    Appearance: He is well-developed. He is not diaphoretic.  HENT:     Head: Normocephalic and atraumatic.     Right Ear: External ear normal.     Left Ear: External ear normal.     Nose: Nose normal.     Mouth/Throat:     Pharynx: No oropharyngeal exudate.  Eyes:     General: No scleral icterus.       Right eye: No discharge.        Left eye: No discharge.     Conjunctiva/sclera: Conjunctivae normal.     Pupils: Pupils are equal, round, and reactive to light.  Neck:     Thyroid: No thyromegaly.     Vascular: No JVD.     Trachea: No tracheal deviation.  Cardiovascular:     Rate and Rhythm: Normal rate and regular rhythm.     Heart sounds: Normal heart sounds. No murmur heard.   No friction rub. No gallop.  Pulmonary:     Effort: Pulmonary effort is normal. No respiratory distress.     Breath sounds: Normal breath sounds. No stridor. No wheezing or rales.  Chest:     Chest wall: No tenderness.  Abdominal:     General: Bowel sounds are normal. There is no distension.     Palpations: Abdomen is soft. There is no mass.     Tenderness: There is no abdominal tenderness. There is no guarding or rebound.  Musculoskeletal:        General: No tenderness or deformity. Normal range of motion.     Cervical back: Normal range of motion and neck supple.  Lymphadenopathy:     Cervical: No cervical adenopathy.  Skin:    General: Skin is warm and dry.     Coloration: Skin is not pale.     Findings: No erythema or rash.  Neurological:     Mental Status: He is alert.     Cranial Nerves: No cranial nerve deficit.     Motor: No abnormal muscle tone.     Coordination: Coordination normal.     Deep Tendon Reflexes: Reflexes are normal and symmetric.  Psychiatric:        Behavior: Behavior normal.        Thought Content: Thought content normal.        Judgment: Judgment normal.     LABS: No results found for this or any  previous visit (from the past 2160 hour(s)).      Assessment/Plan: 1. Encounter for general adult medical examination with abnormal findings CPE performed, due for routine fasting labs, UTD on colonoscopy  2. Obstructive chronic bronchitis without exacerbation (HCC) Stable, continue inhalers  3. Barrett's esophagus without dysplasia May continue omeprazole, advised to follow up with GI is any issues arise  4. Mixed hyperlipidemia May continue lipitor and will update labs - Lipid Panel With LDL/HDL Ratio  5. Screening for prostate cancer - PSA Total (Reflex To Free)  6. Abnormal thyroid blood test - TSH + free T4  7. Other fatigue - CBC w/Diff/Platelet - Comprehensive metabolic panel   General Counseling: Esmond verbalizes understanding  of the findings of todays visit and agrees with plan of treatment. I have discussed any further diagnostic evaluation that may be needed or ordered today. We also reviewed his medications today. he has been encouraged to call the office with any questions or concerns that should arise related to todays visit.    Counseling:    Orders Placed This Encounter  Procedures   CBC w/Diff/Platelet   Comprehensive metabolic panel   Lipid Panel With LDL/HDL Ratio   TSH + free T4   PSA Total (Reflex To Free)    No orders of the defined types were placed in this encounter.   This patient was seen by Lynn Ito, PA-C in collaboration with Dr. Beverely Risen as a part of collaborative care agreement.  Total time spent:30 Minutes  Time spent includes review of chart, medications, test results, and follow up plan with the patient.     Lyndon Code, MD  Internal Medicine

## 2020-12-25 NOTE — Addendum Note (Signed)
Addended by: Loura Back B on: 12/25/2020 10:13 AM   Modules accepted: Orders

## 2020-12-26 LAB — UA/M W/RFLX CULTURE, ROUTINE
Bilirubin, UA: NEGATIVE
Glucose, UA: NEGATIVE
Ketones, UA: NEGATIVE
Leukocytes,UA: NEGATIVE
Nitrite, UA: NEGATIVE
Protein,UA: NEGATIVE
RBC, UA: NEGATIVE
Specific Gravity, UA: 1.023 (ref 1.005–1.030)
Urobilinogen, Ur: 0.2 mg/dL (ref 0.2–1.0)
pH, UA: 6 (ref 5.0–7.5)

## 2020-12-26 LAB — MICROSCOPIC EXAMINATION
Bacteria, UA: NONE SEEN
Casts: NONE SEEN /lpf
Epithelial Cells (non renal): NONE SEEN /hpf (ref 0–10)
RBC, Urine: NONE SEEN /hpf (ref 0–2)
WBC, UA: NONE SEEN /hpf (ref 0–5)

## 2021-01-13 ENCOUNTER — Other Ambulatory Visit: Payer: Self-pay | Admitting: Physician Assistant

## 2021-01-13 DIAGNOSIS — K2270 Barrett's esophagus without dysplasia: Secondary | ICD-10-CM

## 2021-01-15 ENCOUNTER — Other Ambulatory Visit: Payer: Self-pay | Admitting: Internal Medicine

## 2021-02-08 LAB — CBC WITH DIFFERENTIAL/PLATELET
Basophils Absolute: 0 10*3/uL (ref 0.0–0.2)
Basos: 1 %
EOS (ABSOLUTE): 0.1 10*3/uL (ref 0.0–0.4)
Eos: 1 %
Hematocrit: 47 % (ref 37.5–51.0)
Hemoglobin: 15.4 g/dL (ref 13.0–17.7)
Immature Grans (Abs): 0 10*3/uL (ref 0.0–0.1)
Immature Granulocytes: 0 %
Lymphocytes Absolute: 2.4 10*3/uL (ref 0.7–3.1)
Lymphs: 28 %
MCH: 30.1 pg (ref 26.6–33.0)
MCHC: 32.8 g/dL (ref 31.5–35.7)
MCV: 92 fL (ref 79–97)
Monocytes Absolute: 0.7 10*3/uL (ref 0.1–0.9)
Monocytes: 8 %
Neutrophils Absolute: 5.3 10*3/uL (ref 1.4–7.0)
Neutrophils: 62 %
Platelets: 284 10*3/uL (ref 150–450)
RBC: 5.11 x10E6/uL (ref 4.14–5.80)
RDW: 13.1 % (ref 11.6–15.4)
WBC: 8.6 10*3/uL (ref 3.4–10.8)

## 2021-02-08 LAB — COMPREHENSIVE METABOLIC PANEL
ALT: 25 IU/L (ref 0–44)
AST: 15 IU/L (ref 0–40)
Albumin/Globulin Ratio: 1.6 (ref 1.2–2.2)
Albumin: 4.2 g/dL (ref 3.8–4.9)
Alkaline Phosphatase: 100 IU/L (ref 44–121)
BUN/Creatinine Ratio: 24 — ABNORMAL HIGH (ref 9–20)
BUN: 18 mg/dL (ref 6–24)
Bilirubin Total: 0.4 mg/dL (ref 0.0–1.2)
CO2: 25 mmol/L (ref 20–29)
Calcium: 9.2 mg/dL (ref 8.7–10.2)
Chloride: 102 mmol/L (ref 96–106)
Creatinine, Ser: 0.74 mg/dL — ABNORMAL LOW (ref 0.76–1.27)
Globulin, Total: 2.6 g/dL (ref 1.5–4.5)
Glucose: 110 mg/dL — ABNORMAL HIGH (ref 70–99)
Potassium: 5 mmol/L (ref 3.5–5.2)
Sodium: 140 mmol/L (ref 134–144)
Total Protein: 6.8 g/dL (ref 6.0–8.5)
eGFR: 104 mL/min/{1.73_m2} (ref >59)

## 2021-02-08 LAB — LIPID PANEL WITH LDL/HDL RATIO
Cholesterol, Total: 140 mg/dL (ref 100–199)
HDL: 32 mg/dL — ABNORMAL LOW (ref >39)
LDL Chol Calc (NIH): 79 mg/dL (ref 0–99)
LDL/HDL Ratio: 2.5 ratio (ref 0.0–3.6)
Triglycerides: 165 mg/dL — ABNORMAL HIGH (ref 0–149)
VLDL Cholesterol Cal: 29 mg/dL (ref 5–40)

## 2021-02-08 LAB — TSH+FREE T4
Free T4: 1.36 ng/dL (ref 0.82–1.77)
TSH: 1.73 u[IU]/mL (ref 0.450–4.500)

## 2021-02-08 LAB — PSA TOTAL (REFLEX TO FREE): Prostate Specific Ag, Serum: 0.6 ng/mL (ref 0.0–4.0)

## 2021-04-09 ENCOUNTER — Encounter: Payer: Self-pay | Admitting: Physician Assistant

## 2021-04-10 ENCOUNTER — Telehealth: Payer: Self-pay

## 2021-04-10 NOTE — Telephone Encounter (Signed)
Pt sent [place] a bill they received from [facility]. OIZTIWP#[id number]  DOS 02/07/21  ?Called [facility], they stated that the balance due $97.94 is going towards the deductible and that coding was correct per insurance company. LMOM need to explain this to pt. ?

## 2021-04-10 NOTE — Telephone Encounter (Signed)
Can u check this//

## 2021-04-28 ENCOUNTER — Other Ambulatory Visit: Payer: Self-pay | Admitting: Physician Assistant

## 2021-04-28 DIAGNOSIS — K2270 Barrett's esophagus without dysplasia: Secondary | ICD-10-CM

## 2021-05-21 ENCOUNTER — Other Ambulatory Visit: Payer: Self-pay | Admitting: Nurse Practitioner

## 2021-05-27 ENCOUNTER — Other Ambulatory Visit: Payer: Self-pay | Admitting: Internal Medicine

## 2021-05-27 DIAGNOSIS — E782 Mixed hyperlipidemia: Secondary | ICD-10-CM

## 2021-06-25 ENCOUNTER — Ambulatory Visit (INDEPENDENT_AMBULATORY_CARE_PROVIDER_SITE_OTHER): Payer: PRIVATE HEALTH INSURANCE | Admitting: Physician Assistant

## 2021-06-25 ENCOUNTER — Encounter: Payer: Self-pay | Admitting: Physician Assistant

## 2021-06-25 VITALS — BP 128/85 | HR 73 | Temp 98.3°F | Resp 16 | Ht 72.0 in | Wt 199.2 lb

## 2021-06-25 DIAGNOSIS — R7301 Impaired fasting glucose: Secondary | ICD-10-CM

## 2021-06-25 DIAGNOSIS — J449 Chronic obstructive pulmonary disease, unspecified: Secondary | ICD-10-CM | POA: Diagnosis not present

## 2021-06-25 LAB — POCT GLYCOSYLATED HEMOGLOBIN (HGB A1C): Hemoglobin A1C: 5.6 % (ref 4.0–5.6)

## 2021-06-25 MED ORDER — CLOTRIMAZOLE-BETAMETHASONE 1-0.05 % EX CREA: 1.0000 "application " | TOPICAL_CREAM | Freq: Every day | CUTANEOUS | 0 refills | Status: AC

## 2021-06-25 NOTE — Progress Notes (Signed)
[facility] [address] [place], [place] [address]  Internal MEDICINE  Office Visit Note  Patient Name: Michael Christian  [id number]  [id number]  Date of Service: 06/25/2021  Chief Complaint  Patient presents with   Follow-up   Hyperlipidemia   COPD    HPI Pt is here for routine follow up. -Previously reviewed labs over the phone which looked good except for elevated Glucose. A1c checked in office was normal -Uses trelegy daily, albuterol occasionally. States breathing has been stable. -stopped smoking at 61yo -will schedule pulmonary visit for spiro as he has not had PFT since 2019 but states his insurance doesn't cover it and would like to have pulmonary visit to discuss further  Current Medication: Outpatient Encounter Medications as of 06/25/2021  Medication Sig   albuterol (PROVENTIL) (2.5 MG/3ML) 0.083% nebulizer solution USE 1 VIAL VIA NEBULIZER EVERY 6 HOURS AS NEEDED FOR WHEEZING OR SHORTNESS OF BREATH   albuterol (VENTOLIN HFA) 108 (90 Base) MCG/ACT inhaler Inhale 2 puffs into the lungs every 6 (six) hours as needed for wheezing or shortness of breath.   atorvastatin (LIPITOR) 20 MG tablet TAKE 1 TABLET BY MOUTH DAILY   clotrimazole-betamethasone (LOTRISONE) cream Apply 1 application  topically daily.   Fluticasone-Umeclidin-Vilant (TRELEGY ELLIPTA) 100-62.5-25 MCG/INH AEPB Inhale 1 puff into the lungs daily.   omeprazole (PRILOSEC) 20 MG capsule TAKE 1 CAPSULE(20 MG) BY MOUTH DAILY   No facility-administered encounter medications on file as of 06/25/2021.    Surgical History: Past Surgical History:  Procedure Laterality Date   BACK SURGERY     TONSILLECTOMY      Medical History: Past Medical History:  Diagnosis Date   Barrett's esophagus without dysplasia    Cervical disc disorder with radiculopathy, unspecified cervical region    Chronic obstructive pulmonary disease (COPD) (HCC)    COPD (chronic obstructive pulmonary disease) (HCC)    Mixed  hyperlipidemia     Family History: Family History  Problem Relation Age of Onset   Alzheimer's disease Father    Lung cancer Brother    Emphysema Brother     Social History   Socioeconomic History   Marital status: Married    Spouse name: Not on file   Number of children: Not on file   Years of education: Not on file   Highest education level: Not on file  Occupational History   Not on file  Tobacco Use   Smoking status: Former   Smokeless tobacco: Never  Vaping Use   Vaping Use: Never used  Substance and Sexual Activity   Alcohol use: No   Drug use: No   Sexual activity: Not on file  Other Topics Concern   Not on file  Social History Narrative   Not on file   Social Determinants of Health   Financial Resource Strain: Not on file  Food Insecurity: Not on file  Transportation Needs: Not on file  Physical Activity: Not on file  Stress: Not on file  Social Connections: Not on file  Intimate Partner Violence: Not on file      Review of Systems  Constitutional:  Negative for chills, fatigue and unexpected weight change.  HENT:  Negative for congestion, postnasal drip, rhinorrhea, sneezing and sore throat.   Eyes:  Negative for redness.  Respiratory:  Negative for cough, chest tightness and shortness of breath.   Cardiovascular:  Negative for chest pain and palpitations.  Gastrointestinal:  Negative for abdominal pain, constipation, diarrhea, nausea and vomiting.  Genitourinary:  Negative  for dysuria and frequency.  Musculoskeletal:  Negative for arthralgias, back pain, joint swelling and neck pain.  Skin:  Negative for rash.  Neurological: Negative.  Negative for tremors and numbness.  Hematological:  Negative for adenopathy. Does not bruise/bleed easily.  Psychiatric/Behavioral:  Negative for behavioral problems (Depression), sleep disturbance and suicidal ideas. The patient is not nervous/anxious.     Vital Signs: BP 128/85   Pulse 73   Temp 98.3 F  (36.8 C)   Resp 16   Ht 6' (1.829 m)   Wt 199 lb 3.2 oz (90.4 kg)   SpO2 96%   BMI 27.02 kg/m    Physical Exam Constitutional:      Appearance: Normal appearance. He is normal weight.  HENT:     Head: Normocephalic and atraumatic.     Right Ear: Tympanic membrane normal.     Left Ear: Tympanic membrane normal.     Nose: Nose normal.     Mouth/Throat:     Mouth: Mucous membranes are moist.     Pharynx: Oropharynx is clear.  Eyes:     Pupils: Pupils are equal, round, and reactive to light.  Cardiovascular:     Rate and Rhythm: Normal rate and regular rhythm.     Pulses: Normal pulses.     Heart sounds: Normal heart sounds.  Pulmonary:     Effort: Pulmonary effort is normal.     Breath sounds: Normal breath sounds.  Abdominal:     General: Abdomen is flat.     Tenderness: There is no abdominal tenderness.  Musculoskeletal:        General: Normal range of motion.     Cervical back: Normal range of motion.  Skin:    General: Skin is warm.  Neurological:     General: No focal deficit present.     Mental Status: He is alert and oriented to person, place, and time. Mental status is at baseline.  Psychiatric:        Mood and Affect: Mood normal.        Behavior: Behavior normal.        Thought Content: Thought content normal.        Judgment: Judgment normal.        Assessment/Plan: 1. Obstructive chronic bronchitis without exacerbation (HCC) Stable, continue inhalers as before and will have pulmonary follow up. Needs spiro if PFT not affordable  2. Impaired fasting glucose - POCT HgB A1C is 5.6 which is normal. Advised to continue working on diet and exercise, but this is reassuring.   General Counseling: Evens verbalizes understanding of the findings of todays visit and agrees with plan of treatment. I have discussed any further diagnostic evaluation that may be needed or ordered today. We also reviewed his medications today. he has been encouraged to call the  office with any questions or concerns that should arise related to todays visit.    Orders Placed This Encounter  Procedures   POCT HgB A1C    Meds ordered this encounter  Medications   clotrimazole-betamethasone (LOTRISONE) cream    Sig: Apply 1 application  topically daily.    Dispense:  30 g    Refill:  0    This patient was seen by Lynn Ito, PA-C in collaboration with Dr. Beverely Risen as a part of collaborative care agreement.   Total time spent:30 Minutes Time spent includes review of chart, medications, test results, and follow up plan with the patient.      Dr  Lavera Guise Internal medicine

## 2021-07-26 ENCOUNTER — Encounter: Payer: Self-pay | Admitting: Internal Medicine

## 2021-07-26 ENCOUNTER — Ambulatory Visit: Payer: PRIVATE HEALTH INSURANCE | Admitting: Internal Medicine

## 2021-07-26 VITALS — BP 135/80 | HR 76 | Temp 98.3°F | Resp 16 | Ht 72.0 in | Wt 198.0 lb

## 2021-07-26 DIAGNOSIS — R0602 Shortness of breath: Secondary | ICD-10-CM | POA: Diagnosis not present

## 2021-07-26 DIAGNOSIS — F17201 Nicotine dependence, unspecified, in remission: Secondary | ICD-10-CM | POA: Diagnosis not present

## 2021-07-26 DIAGNOSIS — J849 Interstitial pulmonary disease, unspecified: Secondary | ICD-10-CM | POA: Diagnosis not present

## 2021-07-26 DIAGNOSIS — J449 Chronic obstructive pulmonary disease, unspecified: Secondary | ICD-10-CM | POA: Diagnosis not present

## 2021-07-26 NOTE — Progress Notes (Signed)
[facility] [address] [place], [place] [address]  Pulmonary Sleep Medicine   Office Visit Note  Patient Name: Michael Christian DOB: Mar 08, 1960 MRN [id number]  Date of Service: 07/26/2021  Complaints/HPI: Patient has severe COPD last PFT done in 2019 states his current inusrance does not cover the PFT. He states he is not smoking quit about 14 years ago. He had a CXR done in 2019 showing ILD. 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 No asbestos exposure. States his father worked in a Pharmacist, hospital. Patient states no other occupational exposure. Denies having CP. States he does have cough and also states he has early morning sputum production. No weight loss  ROS  General: (-) fever, (-) chills, (-) night sweats, (-) weakness Skin: (-) rashes, (-) itching,. Eyes: (-) visual changes, (-) redness, (-) itching. Nose and Sinuses: (-) nasal stuffiness or itchiness, (-) postnasal drip, (-) nosebleeds, (-) sinus trouble. Mouth and Throat: (-) sore throat, (-) hoarseness. Neck: (-) swollen glands, (-) enlarged thyroid, (-) neck pain. Respiratory: + cough, (-) bloody sputum, + shortness of breath, - wheezing. Cardiovascular: - ankle swelling, (-) chest pain. Lymphatic: (-) lymph node enlargement. Neurologic: (-) numbness, (-) tingling. Psychiatric: (-) anxiety, (-) depression   Current Medication: Outpatient Encounter Medications as of 07/26/2021  Medication Sig   albuterol (PROVENTIL) (2.5 MG/3ML) 0.083% nebulizer solution USE 1 VIAL VIA NEBULIZER EVERY 6 HOURS AS NEEDED FOR WHEEZING OR SHORTNESS OF BREATH   albuterol (VENTOLIN HFA) 108 (90 Base) MCG/ACT inhaler Inhale 2 puffs into the lungs every 6 (six) hours as needed for wheezing or shortness of breath.   atorvastatin (LIPITOR) 20 MG tablet TAKE 1 TABLET BY MOUTH DAILY   clotrimazole-betamethasone (LOTRISONE) cream Apply 1 application  topically daily.   Fluticasone-Umeclidin-Vilant (TRELEGY ELLIPTA)  100-62.5-25 MCG/INH AEPB Inhale 1 puff into the lungs daily.   omeprazole (PRILOSEC) 20 MG capsule TAKE 1 CAPSULE(20 MG) BY MOUTH DAILY   No facility-administered encounter medications on file as of 07/26/2021.    Surgical History: Past Surgical History:  Procedure Laterality Date   BACK SURGERY     TONSILLECTOMY      Medical History: Past Medical History:  Diagnosis Date   Barrett's esophagus without dysplasia    Cervical disc disorder with radiculopathy, unspecified cervical region    Chronic obstructive pulmonary disease (COPD) (HCC)    COPD (chronic obstructive pulmonary disease) (HCC)    Mixed hyperlipidemia     Family History: Family History  Problem Relation Age of Onset   Alzheimer's disease Father    Lung cancer Brother    Emphysema Brother     Social History: Social History   Socioeconomic History   Marital status: Married    Spouse name: Not on file   Number of children: Not on file   Years of education: Not on file   Highest education level: Not on file  Occupational History   Not on file  Tobacco Use   Smoking status: Former   Smokeless tobacco: Never  Vaping Use   Vaping Use: Never used  Substance and Sexual Activity   Alcohol use: No   Drug use: No   Sexual activity: Not on file  Other Topics Concern   Not on file  Social History Narrative   Not on file   Social Determinants of Health   Financial Resource Strain: Not on file  Food Insecurity: Not on file  Transportation Needs: Not on file  Physical Activity: Not  on file  Stress: Not on file  Social Connections: Not on file  Intimate Partner Violence: Not on file    Vital Signs: Blood pressure 135/80, pulse 76, temperature 98.3 F (36.8 C), resp. rate 16, height 6' (1.829 m), weight 198 lb (89.8 kg), SpO2 97 %.  Examination: General Appearance: The patient is well-developed, well-nourished, and in no distress. Skin: Gross inspection of skin unremarkable. Head: normocephalic, no  gross deformities. Eyes: no gross deformities noted. ENT: ears appear grossly normal no exudates. Neck: Supple. No thyromegaly. No LAD. Respiratory: few rhonchi and crackles. Cardiovascular: Normal S1 and S2 without murmur or rub. Extremities: No cyanosis. pulses are equal. Neurologic: Alert and oriented. No involuntary movements.  LABS: Recent Results (from the past 2160 hour(s))  POCT HgB A1C     Status: None   Collection Time: 06/25/21  8:43 AM  Result Value Ref Range   Hemoglobin A1C 5.6 4.0 - 5.6 %   HbA1c POC (<> result, manual entry)     HbA1c, POC (prediabetic range)     HbA1c, POC (controlled diabetic range)      Radiology: DG Chest 2 View  Result Date: 06/05/2017 CLINICAL DATA:  COPD.  Difficulty breathing. EXAM: CHEST - 2 VIEW COMPARISON:  04/24/2016. FINDINGS: Mediastinum is normal. Heart size normal. No pulmonary venous congestion. Diffuse interstitial prominence noted consistent with chronic interstitial lung disease. No pleural effusion or pneumothorax. No acute bony abnormality. IMPRESSION: Diffuse bilateral pulmonary interstitial prominence noted consistent chronic interstitial lung disease. Similar findings on prior exam. Electronically Signed   By: Maisie Fus  Register   On: 06/05/2017 12:59    No results found.  No results found.    Assessment and Plan: Patient Active Problem List   Diagnosis Date Noted   Encounter for general adult medical examination with abnormal findings 11/16/2018   Dysuria 11/16/2018   Obstructive chronic bronchitis without exacerbation (HCC) 05/11/2018   Chronic obstructive pulmonary disease with acute exacerbation (HCC) 05/11/2018   Cervical disc disorder with radiculopathy, unspecified cervical region 01/31/2017   Emphysema, unspecified (HCC) 01/31/2017   COPD (chronic obstructive pulmonary disease) with emphysema (HCC) 01/31/2017   Mixed hyperlipidemia 01/31/2017   Barrett's esophagus without dysplasia 01/31/2017    1. SOB  (shortness of breath)  - Spirometry with Graph  2. Obstructive chronic bronchitis without exacerbation (HCC) Severe disease based on spiro and PFT. In addition he has got ILD with what appears to be fibrotic disease. Will get CT chest to further evaluate  3. ILD (interstitial lung disease) (HCC) Impressive CXR from 2019 for what appears to be fibrotic changes. I will order a HRCT of chest to assess for ILD fibrosis IPF - CT Chest High Resolution; Future  4. Tobacco abuse, in remission In remission needs screening CT will get as above - CT Chest High Resolution; Future    General Counseling: I have discussed the findings of the evaluation and examination with Lorrie.  I have also discussed any further diagnostic evaluation thatmay be needed or ordered today. Aniel verbalizes understanding of the findings of todays visit. We also reviewed his medications today and discussed drug interactions and side effects including but not limited excessive drowsiness and altered mental states. We also discussed that there is always a risk not just to him but also people around him. he has been encouraged to call the office with any questions or concerns that should arise related to todays visit.  Orders Placed This Encounter  Procedures   Spirometry with Graph  Order Specific Question:   Where should this test be performed?    Answer:   [facility]    Order Specific Question:   Basic spirometry    Answer:   Yes     Time spent: 86  I have personally obtained a history, examined the patient, evaluated laboratory and imaging results, formulated the assessment and plan and placed orders.    Yevonne Pax, MD [facility] Pulmonary and Critical Care Sleep medicine

## 2021-07-26 NOTE — Patient Instructions (Signed)
Chronic Obstructive Pulmonary Disease  Chronic obstructive pulmonary disease (COPD) is a long-term (chronic) lung problem. When you have COPD, it is hard for air to get in and out of your lungs. Usually the condition gets worse over time, and your lungs will never return to normal. There are things you can do to keep yourself as healthy as possible. What are the causes? Smoking. This is the most common cause. Certain genes passed from parent to child (inherited). What increases the risk? Being exposed to secondhand smoke from cigarettes, pipes, or cigars. Being exposed to chemicals and other irritants, such as fumes and dust in the work environment. Having chronic lung conditions or infections. What are the signs or symptoms? Shortness of breath, especially during physical activity. A long-term cough with a large amount of thick mucus. Sometimes, the cough may not have any mucus (dry cough). Wheezing. Breathing quickly. Skin that looks gray or blue, especially in the fingers, toes, or lips. Feeling tired (fatigue). Weight loss. Chest tightness. Having infections often. Episodes when breathing symptoms become much worse (exacerbations). At the later stages of this disease, you may have swelling in the ankles, feet, or legs. How is this treated? Taking medicines. Quitting smoking, if you smoke. Rehabilitation. This includes steps to make your body work better. It may involve a team of specialists. Doing exercises. Making changes to your diet. Using oxygen. Lung surgery. Lung transplant. Comfort measures (palliative care). Follow these instructions at home: Medicines Take over-the-counter and prescription medicines only as told by your doctor. Talk to your doctor before taking any cough or allergy medicines. You may need to avoid medicines that cause your lungs to be dry. Lifestyle If you smoke, stop smoking. Smoking makes the problem worse. Do not smoke or use any products that  contain nicotine or tobacco. If you need help quitting, ask your doctor. Avoid being around things that make your breathing worse. This may include smoke, chemicals, and fumes. Stay active, but remember to rest as well. Learn and use tips on how to manage stress and control your breathing. Make sure you get enough sleep. Most adults need at least 7 hours of sleep every night. Eat healthy foods. Eat smaller meals more often. Rest before meals. Controlled breathing Learn and use tips on how to control your breathing as told by your doctor. Try: Breathing in (inhaling) through your nose for 1 second. Then, pucker your lips and breath out (exhale) through your lips for 2 seconds. Putting one hand on your belly (abdomen). Breathe in slowly through your nose for 1 second. Your hand on your belly should move out. Pucker your lips and breathe out slowly through your lips. Your hand on your belly should move in as you breathe out.  Controlled coughing Learn and use controlled coughing to clear mucus from your lungs. Follow these steps: Lean your head a little forward. Breathe in deeply. Try to hold your breath for 3 seconds. Keep your mouth slightly open while coughing 2 times. Spit any mucus out into a tissue. Rest and do the steps again 1 or 2 times as needed. General instructions Make sure you get all the shots (vaccines) that your doctor recommends. Ask your doctor about a flu shot and a pneumonia shot. Use oxygen therapy and pulmonary rehabilitation if told by your doctor. If you need home oxygen therapy, ask your doctor if you should buy a tool to measure your oxygen level (oximeter). Make a COPD action plan with your doctor. This helps you   to know what to do if you feel worse than usual. Manage any other conditions you have as told by your doctor. Avoid going outside when it is very hot, cold, or humid. Avoid people who have a sickness you can catch (contagious). Keep all follow-up  visits. Contact a doctor if: You cough up more mucus than usual. There is a change in the color or thickness of the mucus. It is harder to breathe than usual. Your breathing is faster than usual. You have trouble sleeping. You need to use your medicines more often than usual. You have trouble doing your normal activities such as getting dressed or walking around the house. Get help right away if: You have shortness of breath while resting. You have shortness of breath that stops you from: Being able to talk. Doing normal activities. Your chest hurts for longer than 5 minutes. Your skin color is more blue than usual. Your pulse oximeter shows that you have low oxygen for longer than 5 minutes. You have a fever. You feel too tired to breathe normally. These symptoms may represent a serious problem that is an emergency. Do not wait to see if the symptoms will go away. Get medical help right away. Call your local emergency services (911 in the [place] Do not drive yourself to the hospital. Summary Chronic obstructive pulmonary disease (COPD) is a long-term lung problem. The way your lungs work will never return to normal. Usually the condition gets worse over time. There are things you can do to keep yourself as healthy as possible. Take over-the-counter and prescription medicines only as told by your doctor. If you smoke, stop. Smoking makes the problem worse. This information is not intended to replace advice given to you by your health care provider. Make sure you discuss any questions you have with your health care provider. Document Revised: 11/09/2019 Document Reviewed: 11/09/2019 Elsevier Patient Education  2023 Else[employer] Inc. Pulmonary Fibrosis  Pulmonary fibrosis is a type of lung disease that causes scarring. Over time, the scar tissue builds up in the air sacs of your lungs (alveoli). This makes it hard for you to breathe because less oxygen gets into your bloodstream. Scarring  from pulmonary fibrosis is permanent and may lead to other serious health problems. What are the causes? There are many different causes of pulmonary fibrosis. In some cases, the cause is not known. This is called idiopathic pulmonary fibrosis. Other causes include: Exposure to chemicals and substances found in agricultural, farm, construction, or factory work. These include mold, asbestos, silica, metal dusts, and toxic fumes. Sarcoidosis. In this disease, areas of inflammatory cells (granulomas) form and most often affect the lungs. Autoimmune diseases. These include diseases such as rheumatoid arthritis, systemic sclerosis, or connective tissue disease. Taking certain medicines. These include drugs used in radiation therapy or used to treat seizures, heart problems, and some infections. What increases the risk? You are more likely to develop this condition if: You have a family history of the disease. You are an older person. The condition is more common in older adults. You have a history of smoking. You have a job that exposes you to certain chemicals. You have gastroesophageal reflux disease (GERD). What are the signs or symptoms? Symptoms of this condition include: Difficulty breathing that gets worse with activity. Shortness of breath (dyspnea). Dry, hacking cough. Rapid, shallow breathing during exercise or while at rest. Other symptoms may include: Loss of appetite or weight loss Tiredness (fatigue) or weakness. Bluish skin and lips. Rounded   and enlarged fingertips (clubbing). How is this diagnosed? This condition may be diagnosed based on: Your symptoms and medical history. A physical exam. You may also have tests, including: A test that involves looking inside your lungs with an instrument (bronchoscopy). Imaging studies of your lungs and heart. Tests to measure how well you are breathing (pulmonary function tests). Blood tests. Tests to see how well your lungs work  while you are walking (pulmonary stress test). A procedure to remove a lung tissue sample to look at it under a microscope (biopsy). How is this treated? There is no cure for pulmonary fibrosis. Treatment focuses on managing symptoms and preventing scarring from getting worse. This may include: Medicines, such as: Steroids to prevent permanent lung changes. Medicines to suppress your body's defense system (immune system). Medicines to help with lung function by reducing inflammation or scarring. Ongoing monitoring with X-rays and lab work. Oxygen therapy. Pulmonary rehabilitation. Surgery. In some cases, a lung transplant is possible. Follow these instructions at home:  Medicines Take over-the-counter and prescription medicines only as told by your health care provider. Keep your vaccinations up to date as recommended by your health care provider. Activity Get regular exercise, but do not pick activities that are too strenuous for you. Ask your health care provider what activities are safe for you. If you have physical limitations, you may get exercise by walking, using a stationary bike, or doing chair exercises. Ask your health care provider about using oxygen while exercising. Do breathing exercises as told by your health care provider. Plan rest periods when you get tired. General instructions Do not use any products that contain nicotine or tobacco. These products include cigarettes, chewing tobacco, and vaping devices, such as e-cigarettes. If you need help quitting, ask your health care provider. If you are exposed to chemicals and substances at work, make sure that you wear a mask or respirator at all times. Learn to manage stress. If you need help to do this, ask your health care provider. Join a pulmonary rehabilitation program or a support group for people with pulmonary fibrosis. Eat small meals often so you do not get too full. Overeating can make breathing trouble  worse. Maintain a healthy weight. Lose weight if you need to. Keep all follow-up visits. This is important. Where to find more information American Lung Association: www.lung.org National Heart, Lung, and Blood Institute: www.nhlbi.nih.gov Pulmonary Fibrosis Foundation: pulmonaryfibrosis.org Contact a health care provider if: You have symptoms that do not get better with medicines. You are not able to be as active as usual. You have trouble taking a deep breath. You have a fever or chills. You have blue lips or skin. You have a lot of headaches. You cough up mucus that is dark in color. You have feelings of depression or sadness. You are unable to sleep because it is hard to breathe. Get help right away if: Your symptoms suddenly worsen. You have chest pain. You cough up blood. You get very confused or sleepy. These symptoms may be an emergency. Get help right away. Call 911. Do not wait to see if the symptoms will go away. Do not drive yourself to the hospital. Summary Pulmonary fibrosis is a type of lung disease that causes scar tissue to build up in the air sacs of your lungs (alveoli) over time. This makes it hard for you to breathe because less oxygen gets into your bloodstream. Scarring from pulmonary fibrosis is permanent and may lead to other serious health problems. You   are more likely to develop this condition if you have a family history of the condition or a job that exposes you to certain chemicals. There is no cure for pulmonary fibrosis. Treatment focuses on managing symptoms and preventing scarring from getting worse. This information is not intended to replace advice given to you by your health care provider. Make sure you discuss any questions you have with your health care provider. Document Revised: 08/22/2020 Document Reviewed: 08/22/2020 Elsevier Patient Education  2023 Elsevier Inc.  

## 2021-07-31 ENCOUNTER — Telehealth: Payer: Self-pay

## 2021-07-31 NOTE — Telephone Encounter (Signed)
Lvm notifying patient of  CT appointment date, time and location-Toni 

## 2021-08-02 ENCOUNTER — Ambulatory Visit: Payer: 59

## 2021-08-03 ENCOUNTER — Other Ambulatory Visit: Payer: Self-pay | Admitting: Physician Assistant

## 2021-08-03 DIAGNOSIS — K2270 Barrett's esophagus without dysplasia: Secondary | ICD-10-CM

## 2021-09-06 ENCOUNTER — Ambulatory Visit: Payer: PRIVATE HEALTH INSURANCE | Admitting: Internal Medicine

## 2021-09-07 ENCOUNTER — Other Ambulatory Visit: Payer: Self-pay | Admitting: Nurse Practitioner

## 2021-09-07 ENCOUNTER — Other Ambulatory Visit: Payer: Self-pay | Admitting: Internal Medicine

## 2021-09-07 DIAGNOSIS — J441 Chronic obstructive pulmonary disease with (acute) exacerbation: Secondary | ICD-10-CM

## 2021-09-07 DIAGNOSIS — E782 Mixed hyperlipidemia: Secondary | ICD-10-CM

## 2021-09-07 MED ORDER — ATORVASTATIN CALCIUM 20 MG PO TABS: 20.0000 mg | ORAL_TABLET | Freq: Every day | ORAL | 2 refills | Status: DC

## 2021-09-07 MED ORDER — ALBUTEROL SULFATE HFA 108 (90 BASE) MCG/ACT IN AERS: 2.0000 | INHALATION_SPRAY | Freq: Four times a day (QID) | RESPIRATORY_TRACT | 3 refills | Status: DC | PRN

## 2021-09-08 ENCOUNTER — Other Ambulatory Visit: Payer: Self-pay | Admitting: Physician Assistant

## 2021-09-08 DIAGNOSIS — K2270 Barrett's esophagus without dysplasia: Secondary | ICD-10-CM

## 2021-12-14 ENCOUNTER — Encounter: Payer: PRIVATE HEALTH INSURANCE | Admitting: Physician Assistant

## 2021-12-25 ENCOUNTER — Telehealth: Payer: Self-pay | Admitting: Physician Assistant

## 2021-12-25 NOTE — Telephone Encounter (Signed)
Left vm and sent mychart message to confirm 12/28/21 appointment-Toni

## 2021-12-27 ENCOUNTER — Other Ambulatory Visit: Payer: Self-pay | Admitting: Internal Medicine

## 2021-12-27 DIAGNOSIS — J441 Chronic obstructive pulmonary disease with (acute) exacerbation: Secondary | ICD-10-CM

## 2021-12-27 NOTE — Telephone Encounter (Signed)
Pt need appt for refills  ?

## 2021-12-28 ENCOUNTER — Encounter: Payer: PRIVATE HEALTH INSURANCE | Admitting: Physician Assistant

## 2022-02-06 ENCOUNTER — Telehealth: Payer: Self-pay | Admitting: Physician Assistant

## 2022-02-06 NOTE — Telephone Encounter (Signed)
Left vm and sent mychart message to confirm 02/08/22 appointment-Michael Christian

## 2022-02-08 ENCOUNTER — Ambulatory Visit (INDEPENDENT_AMBULATORY_CARE_PROVIDER_SITE_OTHER): Payer: Medicare HMO | Admitting: Physician Assistant

## 2022-02-08 ENCOUNTER — Encounter: Payer: Self-pay | Admitting: Physician Assistant

## 2022-02-08 VITALS — BP 137/84 | HR 94 | Temp 97.7°F | Resp 16 | Ht 72.0 in | Wt 202.8 lb

## 2022-02-08 DIAGNOSIS — E559 Vitamin D deficiency, unspecified: Secondary | ICD-10-CM

## 2022-02-08 DIAGNOSIS — Z0001 Encounter for general adult medical examination with abnormal findings: Secondary | ICD-10-CM | POA: Diagnosis not present

## 2022-02-08 DIAGNOSIS — R5383 Other fatigue: Secondary | ICD-10-CM

## 2022-02-08 DIAGNOSIS — R300 Dysuria: Secondary | ICD-10-CM

## 2022-02-08 DIAGNOSIS — J4489 Other specified chronic obstructive pulmonary disease: Secondary | ICD-10-CM

## 2022-02-08 DIAGNOSIS — K2270 Barrett's esophagus without dysplasia: Secondary | ICD-10-CM

## 2022-02-08 DIAGNOSIS — J849 Interstitial pulmonary disease, unspecified: Secondary | ICD-10-CM | POA: Diagnosis not present

## 2022-02-08 DIAGNOSIS — R7989 Other specified abnormal findings of blood chemistry: Secondary | ICD-10-CM

## 2022-02-08 DIAGNOSIS — E782 Mixed hyperlipidemia: Secondary | ICD-10-CM | POA: Diagnosis not present

## 2022-02-08 DIAGNOSIS — Z125 Encounter for screening for malignant neoplasm of prostate: Secondary | ICD-10-CM

## 2022-02-08 MED ORDER — OMEPRAZOLE 20 MG PO CPDR: DELAYED_RELEASE_CAPSULE | ORAL | 3 refills | Status: DC

## 2022-02-08 MED ORDER — ALBUTEROL SULFATE (2.5 MG/3ML) 0.083% IN NEBU: INHALATION_SOLUTION | RESPIRATORY_TRACT | 3 refills | Status: DC

## 2022-02-08 MED ORDER — ALBUTEROL SULFATE HFA 108 (90 BASE) MCG/ACT IN AERS: 2.0000 | INHALATION_SPRAY | Freq: Four times a day (QID) | RESPIRATORY_TRACT | 0 refills | Status: DC | PRN

## 2022-02-08 MED ORDER — ATORVASTATIN CALCIUM 20 MG PO TABS: 20.0000 mg | ORAL_TABLET | Freq: Every day | ORAL | 2 refills | Status: DC

## 2022-02-08 NOTE — Progress Notes (Signed)
[facility] [address] [place], [place] [address]  Internal MEDICINE  Office Visit Note  Patient Name: Michael Christian  [id number]  [id number]  Date of Service: 02/08/2022  Chief Complaint  Patient presents with   Annual Exam   Hyperlipidemia   Quality Metric Gaps    TDAP     HPI Pt is here for routine health maintenance examination -Breathing has gotten a little worse. Some more SOB with exertion. Just got new insurance and will follow up with pulm to see if previously ordered CT and PFT now could be covered as they previously were very expensive on old insurance and therefore did not have them done.  -He does monitor his O2 at home and is typically in the low 90s, but may drop to 89 if going up stairs, but then recovers. May need to consider walk test as well. -UTD on colonoscopy -Will look for last tdap date and update if it has been 10 or more years since last. Declines shingles vaccine -recent eye exam and updated glasses -Will order routine fasting labs  Current Medication: Outpatient Encounter Medications as of 02/08/2022  Medication Sig   clotrimazole-betamethasone (LOTRISONE) cream Apply 1 application  topically daily.   Fluticasone-Umeclidin-Vilant (TRELEGY ELLIPTA) 100-62.5-25 MCG/INH AEPB Inhale 1 puff into the lungs daily.   [DISCONTINUED] albuterol (PROVENTIL) (2.5 MG/3ML) 0.083% nebulizer solution USE 1 VIAL VIA NEBULIZER EVERY 6 HOURS AS NEEDED FOR WHEEZING OR SHORTNESS OF BREATH   [DISCONTINUED] albuterol (VENTOLIN HFA) 108 (90 Base) MCG/ACT inhaler INHALE 2 PUFFS INTO THE LUNGS EVERY 6 HOURS AS NEEDED FOR WHEEZING OR SHORTNESS OF BREATH   [DISCONTINUED] atorvastatin (LIPITOR) 20 MG tablet Take 1 tablet (20 mg total) by mouth daily.   [DISCONTINUED] omeprazole (PRILOSEC) 20 MG capsule TAKE 1 CAPSULE(20 MG) BY MOUTH DAILY   albuterol (PROVENTIL) (2.5 MG/3ML) 0.083% nebulizer solution USE 1 VIAL VIA NEBULIZER EVERY 6 HOURS AS NEEDED FOR WHEEZING  OR SHORTNESS OF BREATH   albuterol (VENTOLIN HFA) 108 (90 Base) MCG/ACT inhaler Inhale 2 puffs into the lungs every 6 (six) hours as needed for wheezing or shortness of breath.   atorvastatin (LIPITOR) 20 MG tablet Take 1 tablet (20 mg total) by mouth daily.   omeprazole (PRILOSEC) 20 MG capsule TAKE 1 CAPSULE(20 MG) BY MOUTH DAILY   No facility-administered encounter medications on file as of 02/08/2022.    Surgical History: Past Surgical History:  Procedure Laterality Date   BACK SURGERY     TONSILLECTOMY      Medical History: Past Medical History:  Diagnosis Date   Barrett's esophagus without dysplasia    Cervical disc disorder with radiculopathy, unspecified cervical region    Chronic obstructive pulmonary disease (COPD) (HCC)    COPD (chronic obstructive pulmonary disease) (HCC)    Mixed hyperlipidemia     Family History: Family History  Problem Relation Age of Onset   Alzheimer's disease Father    Lung cancer Brother    Emphysema Brother       Review of Systems  Constitutional:  Negative for chills, fatigue and unexpected weight change.  HENT:  Negative for congestion, postnasal drip, rhinorrhea, sneezing and sore throat.   Eyes:  Negative for redness.  Respiratory:  Negative for cough, chest tightness and wheezing.   Cardiovascular:  Negative for chest pain and palpitations.  Gastrointestinal:  Negative for abdominal pain, constipation, diarrhea, nausea and vomiting.  Genitourinary:  Negative for dysuria and frequency.  Musculoskeletal:  Negative for arthralgias, back pain, joint swelling  and neck pain.  Skin:  Negative for rash.  Neurological: Negative.  Negative for tremors and numbness.  Hematological:  Negative for adenopathy. Does not bruise/bleed easily.  Psychiatric/Behavioral:  Negative for behavioral problems (Depression), sleep disturbance and suicidal ideas. The patient is not nervous/anxious.      Vital Signs: BP 137/84   Pulse 94   Temp 97.7 F  (36.5 C)   Resp 16   Ht 6' (1.829 m)   Wt 202 lb 12.8 oz (92 kg)   SpO2 93%   BMI 27.50 kg/m    Physical Exam Constitutional:      Appearance: Normal appearance. He is normal weight.  HENT:     Head: Normocephalic and atraumatic.     Right Ear: Tympanic membrane normal.     Left Ear: Tympanic membrane normal.     Nose: Nose normal.     Mouth/Throat:     Mouth: Mucous membranes are moist.     Pharynx: Oropharynx is clear.  Eyes:     Pupils: Pupils are equal, round, and reactive to light.  Cardiovascular:     Rate and Rhythm: Normal rate and regular rhythm.     Pulses: Normal pulses.     Heart sounds: Normal heart sounds.  Pulmonary:     Effort: Pulmonary effort is normal.     Breath sounds: Normal breath sounds.  Abdominal:     General: Abdomen is flat.     Tenderness: There is no abdominal tenderness.  Musculoskeletal:        General: Normal range of motion.     Cervical back: Normal range of motion.     Right lower leg: No edema.     Left lower leg: No edema.  Skin:    General: Skin is warm.  Neurological:     General: No focal deficit present.     Mental Status: He is alert and oriented to person, place, and time. Mental status is at baseline.  Psychiatric:        Mood and Affect: Mood normal.        Behavior: Behavior normal.        Thought Content: Thought content normal.        Judgment: Judgment normal.      LABS: No results found for this or any previous visit (from the past 2160 hour(s)).      Assessment/Plan: 1. Encounter for general adult medical examination with abnormal findings CPE performed, routine fasting labs ordered, declines shingles vaccine and will check on last tdap vaccine  2. Obstructive chronic bronchitis without exacerbation May continue inhalers as prescribed and will schedule pulmonary follow up to discuss reordering CT chest and PFT now that insurance has changed and might cover testing. May also need to consider 36min walk  and pt advised to continue monitoring O2 at home - albuterol (PROVENTIL) (2.5 MG/3ML) 0.083% nebulizer solution; USE 1 VIAL VIA NEBULIZER EVERY 6 HOURS AS NEEDED FOR WHEEZING OR SHORTNESS OF BREATH  Dispense: 75 mL; Refill: 3 - albuterol (VENTOLIN HFA) 108 (90 Base) MCG/ACT inhaler; Inhale 2 puffs into the lungs every 6 (six) hours as needed for wheezing or shortness of breath.  Dispense: 18 g; Refill: 0  3. ILD (interstitial lung disease) ([place]) May continue inhalers as prescribed and will schedule pulmonary follow up to discuss reordering CT chest and PFT now that insurance has changed and might cover testing. May also need to consider 73min walk and pt advised to continue monitoring O2 at home  4. Mixed hyperlipidemia Continue lipitor and update labs - Lipid Panel With LDL/HDL Ratio - atorvastatin (LIPITOR) 20 MG tablet; Take 1 tablet (20 mg total) by mouth daily.  Dispense: 90 tablet; Refill: 2  5. Other fatigue - CBC w/Diff/Platelet - Comprehensive metabolic panel - Lipid Panel With LDL/HDL Ratio - TSH + free T4 - PSA Total (Reflex To Free) - VITAMIN D 25 Hydroxy (Vit-D Deficiency, Fractures)  6. Vitamin D deficiency - VITAMIN D 25 Hydroxy (Vit-D Deficiency, Fractures)  7. Screening for prostate cancer - PSA Total (Reflex To Free)  8. Abnormal thyroid blood test - TSH + free T4  9. Barrett's esophagus without dysplasia - omeprazole (PRILOSEC) 20 MG capsule; TAKE 1 CAPSULE(20 MG) BY MOUTH DAILY  Dispense: 30 capsule; Refill: 3  10. Dysuria - UA/M w/rflx Culture, Routine   General Counseling: Presten verbalizes understanding of the findings of todays visit and agrees with plan of treatment. I have discussed any further diagnostic evaluation that may be needed or ordered today. We also reviewed his medications today. he has been encouraged to call the office with any questions or concerns that should arise related to todays visit.    Counseling:    Orders Placed This  Encounter  Procedures   UA/M w/rflx Culture, Routine   CBC w/Diff/Platelet   Comprehensive metabolic panel   Lipid Panel With LDL/HDL Ratio   TSH + free T4   PSA Total (Reflex To Free)   VITAMIN D 25 Hydroxy (Vit-D Deficiency, Fractures)    Meds ordered this encounter  Medications   atorvastatin (LIPITOR) 20 MG tablet    Sig: Take 1 tablet (20 mg total) by mouth daily.    Dispense:  90 tablet    Refill:  2   albuterol (PROVENTIL) (2.5 MG/3ML) 0.083% nebulizer solution    Sig: USE 1 VIAL VIA NEBULIZER EVERY 6 HOURS AS NEEDED FOR WHEEZING OR SHORTNESS OF BREATH    Dispense:  75 mL    Refill:  3   albuterol (VENTOLIN HFA) 108 (90 Base) MCG/ACT inhaler    Sig: Inhale 2 puffs into the lungs every 6 (six) hours as needed for wheezing or shortness of breath.    Dispense:  18 g    Refill:  0   omeprazole (PRILOSEC) 20 MG capsule    Sig: TAKE 1 CAPSULE(20 MG) BY MOUTH DAILY    Dispense:  30 capsule    Refill:  3    This patient was seen by Drema Dallas, PA-C in collaboration with Dr. Clayborn Bigness as a part of collaborative care agreement.  Total time spent:35 Minutes  Time spent includes review of chart, medications, test results, and follow up plan with the patient.     Lavera Guise, MD  Internal Medicine

## 2022-02-09 LAB — UA/M W/RFLX CULTURE, ROUTINE
Bilirubin, UA: NEGATIVE
Glucose, UA: NEGATIVE
Ketones, UA: NEGATIVE
Leukocytes,UA: NEGATIVE
Nitrite, UA: NEGATIVE
Protein,UA: NEGATIVE
RBC, UA: NEGATIVE
Specific Gravity, UA: 1.013 (ref 1.005–1.030)
Urobilinogen, Ur: 0.2 mg/dL (ref 0.2–1.0)
pH, UA: 7.5 (ref 5.0–7.5)

## 2022-02-09 LAB — MICROSCOPIC EXAMINATION
Bacteria, UA: NONE SEEN
Casts: NONE SEEN /lpf
Epithelial Cells (non renal): NONE SEEN /hpf (ref 0–10)
WBC, UA: NONE SEEN /hpf (ref 0–5)

## 2022-02-20 DIAGNOSIS — R7989 Other specified abnormal findings of blood chemistry: Secondary | ICD-10-CM | POA: Diagnosis not present

## 2022-02-20 DIAGNOSIS — E782 Mixed hyperlipidemia: Secondary | ICD-10-CM | POA: Diagnosis not present

## 2022-02-20 DIAGNOSIS — E559 Vitamin D deficiency, unspecified: Secondary | ICD-10-CM | POA: Diagnosis not present

## 2022-02-20 DIAGNOSIS — R5383 Other fatigue: Secondary | ICD-10-CM | POA: Diagnosis not present

## 2022-02-20 DIAGNOSIS — Z125 Encounter for screening for malignant neoplasm of prostate: Secondary | ICD-10-CM | POA: Diagnosis not present

## 2022-02-21 LAB — LIPID PANEL WITH LDL/HDL RATIO
Cholesterol, Total: 141 mg/dL (ref 100–199)
HDL: 38 mg/dL — ABNORMAL LOW (ref >39)
LDL Chol Calc (NIH): 82 mg/dL (ref 0–99)
LDL/HDL Ratio: 2.2 ratio (ref 0.0–3.6)
Triglycerides: 112 mg/dL (ref 0–149)
VLDL Cholesterol Cal: 21 mg/dL (ref 5–40)

## 2022-02-21 LAB — CBC WITH DIFFERENTIAL/PLATELET
Basophils Absolute: 0 10*3/uL (ref 0.0–0.2)
Basos: 0 %
EOS (ABSOLUTE): 0.1 10*3/uL (ref 0.0–0.4)
Eos: 1 %
Hematocrit: 51.8 % — ABNORMAL HIGH (ref 37.5–51.0)
Hemoglobin: 17.4 g/dL (ref 13.0–17.7)
Immature Grans (Abs): 0 10*3/uL (ref 0.0–0.1)
Immature Granulocytes: 0 %
Lymphocytes Absolute: 2.3 10*3/uL (ref 0.7–3.1)
Lymphs: 28 %
MCH: 31.5 pg (ref 26.6–33.0)
MCHC: 33.6 g/dL (ref 31.5–35.7)
MCV: 94 fL (ref 79–97)
Monocytes Absolute: 0.6 10*3/uL (ref 0.1–0.9)
Monocytes: 8 %
Neutrophils Absolute: 5.1 10*3/uL (ref 1.4–7.0)
Neutrophils: 63 %
Platelets: 322 10*3/uL (ref 150–450)
RBC: 5.53 x10E6/uL (ref 4.14–5.80)
RDW: 12.6 % (ref 11.6–15.4)
WBC: 8.2 10*3/uL (ref 3.4–10.8)

## 2022-02-21 LAB — COMPREHENSIVE METABOLIC PANEL
ALT: 13 IU/L (ref 0–44)
AST: 14 IU/L (ref 0–40)
Albumin/Globulin Ratio: 1.8 (ref 1.2–2.2)
Albumin: 4.5 g/dL (ref 3.8–4.9)
Alkaline Phosphatase: 100 IU/L (ref 44–121)
BUN/Creatinine Ratio: 17 (ref 10–24)
BUN: 14 mg/dL (ref 8–27)
Bilirubin Total: 0.5 mg/dL (ref 0.0–1.2)
CO2: 24 mmol/L (ref 20–29)
Calcium: 9.7 mg/dL (ref 8.6–10.2)
Chloride: 101 mmol/L (ref 96–106)
Creatinine, Ser: 0.84 mg/dL (ref 0.76–1.27)
Globulin, Total: 2.5 g/dL (ref 1.5–4.5)
Glucose: 115 mg/dL — ABNORMAL HIGH (ref 70–99)
Potassium: 5 mmol/L (ref 3.5–5.2)
Sodium: 141 mmol/L (ref 134–144)
Total Protein: 7 g/dL (ref 6.0–8.5)
eGFR: 100 mL/min/{1.73_m2} (ref >59)

## 2022-02-21 LAB — TSH+FREE T4
Free T4: 1.46 ng/dL (ref 0.82–1.77)
TSH: 1.2 u[IU]/mL (ref 0.450–4.500)

## 2022-02-21 LAB — PSA TOTAL (REFLEX TO FREE): Prostate Specific Ag, Serum: 0.5 ng/mL (ref 0.0–4.0)

## 2022-02-21 LAB — VITAMIN D 25 HYDROXY (VIT D DEFICIENCY, FRACTURES): Vit D, 25-Hydroxy: 23.4 ng/mL — ABNORMAL LOW (ref 30.0–100.0)

## 2022-02-26 ENCOUNTER — Telehealth: Payer: Self-pay

## 2022-02-26 ENCOUNTER — Ambulatory Visit
Admission: RE | Admit: 2022-02-26 | Discharge: 2022-02-26 | Disposition: A | Discharge status: Home / Self Care | Payer: Medicare HMO | Source: Ambulatory Visit | Attending: Internal Medicine | Admitting: Internal Medicine

## 2022-02-26 ENCOUNTER — Other Ambulatory Visit: Payer: Self-pay

## 2022-02-26 DIAGNOSIS — J449 Chronic obstructive pulmonary disease, unspecified: Secondary | ICD-10-CM | POA: Diagnosis not present

## 2022-02-26 DIAGNOSIS — R918 Other nonspecific abnormal finding of lung field: Secondary | ICD-10-CM | POA: Diagnosis not present

## 2022-02-26 DIAGNOSIS — F17201 Nicotine dependence, unspecified, in remission: Secondary | ICD-10-CM | POA: Diagnosis present

## 2022-02-26 DIAGNOSIS — J849 Interstitial pulmonary disease, unspecified: Secondary | ICD-10-CM | POA: Diagnosis not present

## 2022-02-26 DIAGNOSIS — R69 Illness, unspecified: Secondary | ICD-10-CM | POA: Diagnosis not present

## 2022-02-26 DIAGNOSIS — R300 Dysuria: Secondary | ICD-10-CM

## 2022-02-26 NOTE — Telephone Encounter (Signed)
Pt notified for labs and put labs order to repeat UA and advised he can do diet for glucose and take OTC vitamin D 2000IU

## 2022-02-26 NOTE — Telephone Encounter (Signed)
-----   Message from Mylinda Latina, PA-C sent at 02/26/2022  9:00 AM EST ----- Please let him know that his sugar continues to be a little elevated. Also his Vit D is low and should supplement. His urine also showed some RBC and I would like to repeat his urine w/ microscopic exam and if still present would need renal US

## 2022-02-26 NOTE — Telephone Encounter (Addendum)
[facility] radiology call for call report [phone] relayed message to dr Clayborn Bigness and pt already had appt with DSK next week to discuss CT for further treatment and also spoke with pt and advised we will ct  at next visit on Monday

## 2022-02-26 NOTE — Telephone Encounter (Signed)
Lmom to call us back 

## 2022-03-04 ENCOUNTER — Encounter: Payer: Self-pay | Admitting: Internal Medicine

## 2022-03-04 ENCOUNTER — Ambulatory Visit: Payer: Medicare HMO

## 2022-03-04 ENCOUNTER — Ambulatory Visit: Payer: Medicare HMO | Admitting: Internal Medicine

## 2022-03-04 ENCOUNTER — Telehealth: Payer: Self-pay | Admitting: Internal Medicine

## 2022-03-04 ENCOUNTER — Telehealth: Payer: Self-pay | Admitting: Physician Assistant

## 2022-03-04 VITALS — BP 138/84 | HR 75 | Temp 97.9°F | Resp 16 | Ht 72.0 in | Wt 202.8 lb

## 2022-03-04 DIAGNOSIS — R300 Dysuria: Secondary | ICD-10-CM

## 2022-03-04 DIAGNOSIS — B399 Histoplasmosis, unspecified: Secondary | ICD-10-CM

## 2022-03-04 DIAGNOSIS — R0602 Shortness of breath: Secondary | ICD-10-CM

## 2022-03-04 DIAGNOSIS — R911 Solitary pulmonary nodule: Secondary | ICD-10-CM | POA: Diagnosis not present

## 2022-03-04 DIAGNOSIS — J4489 Other specified chronic obstructive pulmonary disease: Secondary | ICD-10-CM

## 2022-03-04 NOTE — Progress Notes (Signed)
Patient here to repeat urinalysis per Lauren.

## 2022-03-04 NOTE — Telephone Encounter (Signed)
Lvm notifying patient of CT appointment date, time, location-Toni

## 2022-03-04 NOTE — Progress Notes (Unsigned)
[facility] [place], [place] [address]  Pulmonary Sleep Medicine   Office Visit Note  Patient Name: Michael Christian DOB: September 18, 1960 MRN [id number]  Date of Service: 03/04/2022  Complaints/HPI: He had a Ct scan which shows multiple nodules no IPF. He also does have emphysematous changes ntoed. States he quit smoking 15 years ago. States he has am cough noted denies having hemoptysis. No chest pain noted. He states he does get SOB with exertion. He states he doesn't have much reserve and is worse up hills and strain. His last FEV1 was 44% and was showing some improvement over the previous. Currently takes trelegy which does seem to help him quite a bit. He was of note born in [place] and lived there for [place] years [place] and now here. So nodules could be also a fungal process of old  ROS  General: (-) fever, (-) chills, (-) night sweats, (-) weakness Skin: (-) rashes, (-) itching,. Eyes: (-) visual changes, (-) redness, (-) itching. Nose and Sinuses: (-) nasal stuffiness or itchiness, (-) postnasal drip, (-) nosebleeds, (-) sinus trouble. Mouth and Throat: (-) sore throat, (-) hoarseness. Neck: (-) swollen glands, (-) enlarged thyroid, (-) neck pain. Respiratory: - cough, (-) bloody sputum, + shortness of breath, - wheezing. Cardiovascular: - ankle swelling, (-) chest pain. Lymphatic: (-) lymph node enlargement. Neurologic: (-) numbness, (-) tingling. Psychiatric: (-) anxiety, (-) depression   Current Medication: Outpatient Encounter Medications as of 03/04/2022  Medication Sig   albuterol (PROVENTIL) (2.5 MG/3ML) 0.083% nebulizer solution USE 1 VIAL VIA NEBULIZER EVERY 6 HOURS AS NEEDED FOR WHEEZING OR SHORTNESS OF BREATH   albuterol (VENTOLIN HFA) 108 (90 Base) MCG/ACT inhaler Inhale 2 puffs into the lungs every 6 (six) hours as needed for wheezing or shortness of breath.   atorvastatin (LIPITOR) 20 MG tablet Take 1  tablet (20 mg total) by mouth daily.   clotrimazole-betamethasone (LOTRISONE) cream Apply 1 application  topically daily.   Fluticasone-Umeclidin-Vilant (TRELEGY ELLIPTA) 100-62.5-25 MCG/INH AEPB Inhale 1 puff into the lungs daily.   omeprazole (PRILOSEC) 20 MG capsule TAKE 1 CAPSULE(20 MG) BY MOUTH DAILY   No facility-administered encounter medications on file as of 03/04/2022.    Surgical History: Past Surgical History:  Procedure Laterality Date   BACK SURGERY     TONSILLECTOMY      Medical History: Past Medical History:  Diagnosis Date   Barrett's esophagus without dysplasia    Cervical disc disorder with radiculopathy, unspecified cervical region    Chronic obstructive pulmonary disease (COPD) (HCC)    COPD (chronic obstructive pulmonary disease) (HCC)    Mixed hyperlipidemia     Family History: Family History  Problem Relation Age of Onset   Alzheimer's disease Father    Lung cancer Brother    Emphysema Brother     Social History: Social History   Socioeconomic History   Marital status: Married    Spouse name: Not on file   Number of children: Not on file   Years of education: Not on file   Highest education level: Not on file  Occupational History   Not on file  Tobacco Use   Smoking status: Former   Smokeless tobacco: Never  Vaping Use   Vaping Use: Never used  Substance and Sexual Activity   Alcohol use: No   Drug use: No   Sexual activity: Not on file  Other Topics Concern   Not on file  Social History Narrative  Not on file   Social Determinants of Health   Financial Resource Strain: Not on file  Food Insecurity: Not on file  Transportation Needs: Not on file  Physical Activity: Not on file  Stress: Not on file  Social Connections: Not on file  Intimate Partner Violence: Not on file    Vital Signs: Blood pressure 138/84, pulse 75, temperature 97.9 F (36.6 C), resp. rate 16, height 6' (1.829 m), weight 202 lb 12.8 oz (92 kg), SpO2 93  %.  Examination: General Appearance: The patient is well-developed, well-nourished, and in no distress. Skin: Gross inspection of skin unremarkable. Head: normocephalic, no gross deformities. Eyes: no gross deformities noted. ENT: ears appear grossly normal no exudates. Neck: Supple. No thyromegaly. No LAD. Respiratory: no rhonchi noted. Cardiovascular: Normal S1 and S2 without murmur or rub. Extremities: No cyanosis. pulses are equal. Neurologic: Alert and oriented. No involuntary movements.  LABS: Recent Results (from the past 2160 hour(s))  UA/M w/rflx Culture, Routine     Status: None   Collection Time: 02/08/22 12:24 PM   Specimen: Urine   Urine  Result Value Ref Range   Specific Gravity, UA 1.013 1.005 - 1.030   pH, UA 7.5 5.0 - 7.5   Color, UA Yellow Yellow   Appearance Ur Clear Clear   Leukocytes,UA Negative Negative   Protein,UA Negative Negative/Trace   Glucose, UA Negative Negative   Ketones, UA Negative Negative   RBC, UA Negative Negative   Bilirubin, UA Negative Negative   Urobilinogen, Ur 0.2 0.2 - 1.0 mg/dL   Nitrite, UA Negative Negative   Microscopic Examination Comment     Comment: Microscopic follows if indicated.   Microscopic Examination See below:     Comment: Microscopic was indicated and was performed.   Urinalysis Reflex Comment     Comment: This specimen will not reflex to a Urine Culture.  Microscopic Examination     Status: Abnormal   Collection Time: 02/08/22 12:24 PM   Urine  Result Value Ref Range   WBC, UA None seen 0 - 5 /hpf   RBC, Urine 3-10 (A) 0 - 2 /hpf   Epithelial Cells (non renal) None seen 0 - 10 /hpf   Casts None seen None seen /lpf   Bacteria, UA None seen None seen/Few  CBC w/Diff/Platelet     Status: Abnormal   Collection Time: 02/20/22  8:20 AM  Result Value Ref Range   WBC 8.2 3.4 - 10.8 x10E3/uL   RBC 5.53 4.14 - 5.80 x10E6/uL   Hemoglobin 17.4 13.0 - 17.7 g/dL   Hematocrit 51.8 (H) 37.5 - 51.0 %   MCV 94 79 - 97  fL   MCH 31.5 26.6 - 33.0 pg   MCHC 33.6 31.5 - 35.7 g/dL   RDW 12.6 11.6 - 15.4 %   Platelets 322 150 - 450 x10E3/uL   Neutrophils 63 Not Estab. %   Lymphs 28 Not Estab. %   Monocytes 8 Not Estab. %   Eos 1 Not Estab. %   Basos 0 Not Estab. %   Neutrophils Absolute 5.1 1.4 - 7.0 x10E3/uL   Lymphocytes Absolute 2.3 0.7 - 3.1 x10E3/uL   Monocytes Absolute 0.6 0.1 - 0.9 x10E3/uL   EOS (ABSOLUTE) 0.1 0.0 - 0.4 x10E3/uL   Basophils Absolute 0.0 0.0 - 0.2 x10E3/uL   Immature Granulocytes 0 Not Estab. %   Immature Grans (Abs) 0.0 0.0 - 0.1 x10E3/uL  Comprehensive metabolic panel     Status: Abnormal   Collection  Time: 02/20/22  8:20 AM  Result Value Ref Range   Glucose 115 (H) 70 - 99 mg/dL   BUN 14 8 - 27 mg/dL   Creatinine, Ser 0.84 0.76 - 1.27 mg/dL   eGFR 100 >59 mL/min/1.73   BUN/Creatinine Ratio 17 10 - 24   Sodium 141 134 - 144 mmol/L   Potassium 5.0 3.5 - 5.2 mmol/L   Chloride 101 96 - 106 mmol/L   CO2 24 20 - 29 mmol/L   Calcium 9.7 8.6 - 10.2 mg/dL   Total Protein 7.0 6.0 - 8.5 g/dL   Albumin 4.5 3.8 - 4.9 g/dL   Globulin, Total 2.5 1.5 - 4.5 g/dL   Albumin/Globulin Ratio 1.8 1.2 - 2.2   Bilirubin Total 0.5 0.0 - 1.2 mg/dL   Alkaline Phosphatase 100 44 - 121 IU/L   AST 14 0 - 40 IU/L   ALT 13 0 - 44 IU/L  Lipid Panel With LDL/HDL Ratio     Status: Abnormal   Collection Time: 02/20/22  8:20 AM  Result Value Ref Range   Cholesterol, Total 141 100 - 199 mg/dL   Triglycerides 112 0 - 149 mg/dL   HDL 38 (L) >39 mg/dL   VLDL Cholesterol Cal 21 5 - 40 mg/dL   LDL Chol Calc (NIH) 82 0 - 99 mg/dL   LDL/HDL Ratio 2.2 0.0 - 3.6 ratio    Comment:                                     LDL/HDL Ratio                                             Men  Women                               1/2 Avg.Risk  1.0    1.5                                   Avg.Risk  3.6    3.2                                2X Avg.Risk  6.2    5.0                                3X Avg.Risk  8.0    6.1   TSH  + free T4     Status: None   Collection Time: 02/20/22  8:20 AM  Result Value Ref Range   TSH 1.200 0.450 - 4.500 uIU/mL   Free T4 1.46 0.82 - 1.77 ng/dL  PSA Total (Reflex To Free)     Status: None   Collection Time: 02/20/22  8:20 AM  Result Value Ref Range   Prostate Specific Ag, Serum 0.5 0.0 - 4.0 ng/mL    Comment: Roche ECLIA methodology. According to the American Urological Association, Serum PSA should decrease and remain at undetectable levels after radical prostatectomy. The AUA defines biochemical recurrence as an initial PSA value 0.2 ng/mL or  greater followed by a subsequent confirmatory PSA value 0.2 ng/mL or greater. Values obtained with different assay methods or kits cannot be used interchangeably. Results cannot be interpreted as absolute evidence of the presence or absence of malignant disease.    Reflex Criteria Comment     Comment: The percent free PSA is performed on a reflex basis only when the total PSA is between 4.0 and 10.0 ng/mL.   VITAMIN D 25 Hydroxy (Vit-D Deficiency, Fractures)     Status: Abnormal   Collection Time: 02/20/22  8:20 AM  Result Value Ref Range   Vit D, 25-Hydroxy 23.4 (L) 30.0 - 100.0 ng/mL    Comment: Vitamin D deficiency has been defined by the [place] practice guideline as a level of serum 25-OH vitamin D less than 20 ng/mL (1,2). The Endocrine Society went on to further define vitamin D insufficiency as a level between 21 and 29 ng/mL (2). 1. IOM (Institute of Medicine). 2010. Dietary reference    intakes for calcium and D. [place]: The    [employer]. 2. Holick MF, Binkley [place], Bischoff-Ferrari HA, et al.    Evaluation, treatment, and prevention of vitamin D    deficiency: an Endocrine Society clinical practice    guideline. JCEM. 2011 Jul; 96(7):1911-30.     Radiology: CT Chest High Resolution  Result Date: 02/26/2022 CLINICAL DATA:  COPD, chronic lung disease,  former smoker. EXAM: CT CHEST WITHOUT CONTRAST TECHNIQUE: Multidetector CT imaging of the chest was performed following the standard protocol without intravenous contrast. High resolution imaging of the lungs, as well as inspiratory and expiratory imaging, was performed. RADIATION DOSE REDUCTION: This exam was performed according to the departmental dose-optimization program which includes automated exposure control, adjustment of the mA and/or kV according to patient size and/or use of iterative reconstruction technique. COMPARISON:  None Available. FINDINGS: Cardiovascular: Atherosclerotic calcification of the aorta and coronary arteries. Enlarged pulmonic trunk. Heart size normal. No pericardial effusion. Mediastinum/Nodes: Mediastinal lymph nodes measure up to 11 mm in the low right paratracheal station. Hilar regions are difficult to evaluate without IV contrast. No axillary adenopathy. Esophagus is grossly unremarkable. Lungs/Pleura: Centrilobular and paraseptal emphysema with scattered scarring image quality is degraded by respiratory motion. Negative for subpleural reticulation, traction bronchiectasis/bronchiolectasis, ground-glass, architectural distortion or honeycombing. Bilateral pulmonary nodules and nodular densities measure up to 0.7 x 2.4 cm in the left lower lobe. No pleural fluid. Minimal debris in the airway. No air trapping. Upper Abdomen: Visualized portions of the liver, adrenal glands, left kidney, spleen, pancreas, stomach and bowel are grossly unremarkable. No upper abdominal adenopathy. Musculoskeletal: Degenerative changes in the spine. No worrisome lytic or sclerotic lesions. IMPRESSION: 1. No evidence of interstitial lung disease. 2. Bilateral pulmonary nodules and nodular areas of nodular consolidation, measuring up to 0.7 x 2.4 cm in the left lower lobe. Malignancy cannot be excluded. Recommend short-term initial follow-up in 3-4 weeks to ensure resolution of the left lower lobe  lesion with future follow-up recommended based on that study. These results will be called to the ordering clinician or representative by the Radiologist Assistant, and communication documented in the PACS or [employer]. 3. Low-dose CT lung cancer screening is recommended for patients who are 17-54 years of age with a 20+ pack-year history of smoking, and who are currently smoking or quit <=15 years ago. 4. Aortic atherosclerosis (ICD10-I70.0). Coronary artery calcification. 5. Pulmonary arterial hypertension. 6.  Emphysema (ICD10-J43.9). Electronically Signed   By: Rip Harbour  Blietz M.D.   On: 02/26/2022 13:15    No results found.  CT Chest High Resolution  Result Date: 02/26/2022 CLINICAL DATA:  COPD, chronic lung disease, former smoker. EXAM: CT CHEST WITHOUT CONTRAST TECHNIQUE: Multidetector CT imaging of the chest was performed following the standard protocol without intravenous contrast. High resolution imaging of the lungs, as well as inspiratory and expiratory imaging, was performed. RADIATION DOSE REDUCTION: This exam was performed according to the departmental dose-optimization program which includes automated exposure control, adjustment of the mA and/or kV according to patient size and/or use of iterative reconstruction technique. COMPARISON:  None Available. FINDINGS: Cardiovascular: Atherosclerotic calcification of the aorta and coronary arteries. Enlarged pulmonic trunk. Heart size normal. No pericardial effusion. Mediastinum/Nodes: Mediastinal lymph nodes measure up to 11 mm in the low right paratracheal station. Hilar regions are difficult to evaluate without IV contrast. No axillary adenopathy. Esophagus is grossly unremarkable. Lungs/Pleura: Centrilobular and paraseptal emphysema with scattered scarring image quality is degraded by respiratory motion. Negative for subpleural reticulation, traction bronchiectasis/bronchiolectasis, ground-glass, architectural distortion or honeycombing.  Bilateral pulmonary nodules and nodular densities measure up to 0.7 x 2.4 cm in the left lower lobe. No pleural fluid. Minimal debris in the airway. No air trapping. Upper Abdomen: Visualized portions of the liver, adrenal glands, left kidney, spleen, pancreas, stomach and bowel are grossly unremarkable. No upper abdominal adenopathy. Musculoskeletal: Degenerative changes in the spine. No worrisome lytic or sclerotic lesions. IMPRESSION: 1. No evidence of interstitial lung disease. 2. Bilateral pulmonary nodules and nodular areas of nodular consolidation, measuring up to 0.7 x 2.4 cm in the left lower lobe. Malignancy cannot be excluded. Recommend short-term initial follow-up in 3-4 weeks to ensure resolution of the left lower lobe lesion with future follow-up recommended based on that study. These results will be called to the ordering clinician or representative by the Radiologist Assistant, and communication documented in the PACS or [employer]. 3. Low-dose CT lung cancer screening is recommended for patients who are 3-5 years of age with a 20+ pack-year history of smoking, and who are currently smoking or quit <=15 years ago. 4. Aortic atherosclerosis (ICD10-I70.0). Coronary artery calcification. 5. Pulmonary arterial hypertension. 6.  Emphysema (ICD10-J43.9). Electronically Signed   By: Lorin Picket M.D.   On: 02/26/2022 13:15      Assessment and Plan: Patient Active Problem List   Diagnosis Date Noted   Encounter for general adult medical examination with abnormal findings 11/16/2018   Dysuria 11/16/2018   Obstructive chronic bronchitis without exacerbation 05/11/2018   Chronic obstructive pulmonary disease with acute exacerbation ([place]) 05/11/2018   Cervical disc disorder with radiculopathy, unspecified cervical region 01/31/2017   Emphysema, unspecified ([place]) 01/31/2017   COPD (chronic obstructive pulmonary disease) with emphysema ([place]) 01/31/2017   Mixed hyperlipidemia 01/31/2017    Barrett's esophagus without dysplasia 01/31/2017    .1. Obstructive chronic bronchitis without exacerbation Continue with current management.  Patient is on inhalers that he should be continued no IPF was noted on the CT scan but there were some nodules.  Patient actually grew up in the [facility] and has lived in that area for most of his life and this very well could be old histoplasma infection  2. SOB (shortness of breath) Probably secondary to obstructive lung disease inhalers should be continued  3. Pulmonary nodule seen on imaging study On the CT scan we will get a follow-up CT to make sure that there is not any increase in size - CT Chest High Resolution; Future  4. Histoplasmosis, unspecified Not confirmed possibility is being entertained that patient has had exposure in the past  General Counseling: I have discussed the findings of the evaluation and examination with Neils.  I have also discussed any further diagnostic evaluation thatmay be needed or ordered today. Lekeith verbalizes understanding of the findings of todays visit. We also reviewed his medications today and discussed drug interactions and side effects including but not limited excessive drowsiness and altered mental states. We also discussed that there is always a risk not just to him but also people around him. he has been encouraged to call the office with any questions or concerns that should arise related to todays visit.  No orders of the defined types were placed in this encounter.    Time spent: 44  I have personally obtained a history, examined the patient, evaluated laboratory and imaging results, formulated the assessment and plan and placed orders.    Allyne Gee, MD [facility] Pulmonary and Critical Care Sleep medicine

## 2022-03-04 NOTE — Telephone Encounter (Signed)
Called and lvm for pt to schedule a repeat urinalysis-nm

## 2022-03-04 NOTE — Patient Instructions (Signed)
Histoplasmosis Histoplasmosis is an infection caused by a type of fungus (Histoplasma). This fungus lives in soil that has been contaminated by bird or bat droppings. Spores may get into the air when contaminated soil is disturbed. People can get infected if they breathe in these spores. Once in the lungs, the spores are warmed by the body and develop into yeast. The infection usually causes no symptoms or only a mild flu-like illness. In some cases, the yeast can cause pneumonia (histoplasmosis pneumonia) or spread to the lymph nodes and other parts of the body through the bloodstream (disseminated histoplasmosis). This type of infection can be life-threatening if not treated. What are the causes? This condition is caused by breathing in spores of the Histoplasma fungus. The infection does not spread from person to person (is not contagious). What increases the risk? You are more likely to develop this condition if: You live in areas where the fungus is common. In the [place], the fungus is most common in the [place] and [place]. You dig in soil, cut down trees, explore caves, clean out chicken coops, or demolish old buildings. The following factors may make you more likely to develop a severe histoplasmosis infection: Having a weak disease-fighting system (immune system) due to HIV or AIDS or taking medicines that suppress your immune system, such as chemotherapy or corticosteroids. Having had an organ transplant. Being a young child or being 70 years of age or older. Smoking. Having chronic obstructive pulmonary disease (COPD). Being exposed to a large amount of Histoplasma fungus or being exposed over a long time. What are the signs or symptoms? People with a normal immune system and healthy lungs usually have no symptoms of infection. If you do have symptoms, they often start a few days to 2 weeks after breathing in spores. Symptoms may include: Fever or chills. Dry  cough. Fatigue. Muscle aches. Headache. Chest pain. Loss of appetite. Shortness of breath. If you have histoplasmosis pneumonia, your symptoms may include: A cough that produces mucus. Night sweats. Weight loss. Severe fatigue and shortness of breath. If you have disseminated histoplasmosis, your symptoms may include: Lip or mouth sores. Skin sores. Paleness. Weakness. How is this diagnosed? This condition may be diagnosed based on: Your symptoms and your history of possible exposure to the fungus. A physical exam. You may also have tests, including: Blood tests. Urine tests. Chest X-ray or other imaging tests. Testing of sputum or lung tissue. How is this treated? You may not need treatment if you are diagnosed with this infection but have only a few or mild symptoms. For most people, the infection will go away on its own in a few weeks. You may need to take an oral antifungal medicine if: You have a fever and other symptoms (acute histoplasmosis). Your symptoms last longer than 4 weeks (chronic histoplasmosis). You have histoplasmosis pneumonia. You have disseminated histoplasmosis. You may need to take the antifungal medicine for 12-18 months. If you have a disseminated infection, you may first start with IV medicine and then switch to a medicine taken by mouth. Follow these instructions at home: Medicines  Take over-the-counter and prescription medicines only as told by your health care provider. If you were prescribed an antifungal medicine, take it as told by your health care provider. Do not stop taking the medicine even if you start to feel better. General instructions  Return to your normal activities as told by your health care provider. Ask your health care provider what activities  are safe for you. Do not use any products that contain nicotine or tobacco. These products include cigarettes, chewing tobacco, and vaping devices, such as e-cigarettes. If you need help  quitting, ask your health care provider. Avoid digging in soil, chopping wood, cleaning chicken coops, tearing down old buildings, and exploring caves. It is possible to get reinfected with histoplasmosis, although the infection is usually less severe. Keep all follow-up visits. This is important. Contact a health care provider if: Your symptoms get worse. Your symptoms are not improving. Get help right away if: You have trouble breathing. You vomit blood or have blood in your stool. These symptoms may be an emergency. Get help right away. Call 911. Do not wait to see if the symptoms will go away. Do not drive yourself to the hospital. Summary Histoplasmosis is an infection caused by breathing in spores of the Histoplasma fungus. This fungus lives in soil that has been contaminated by bird or bat droppings. The infection is not contagious. People with a normal immune system and healthy lungs usually have no symptoms of infection. You may have symptoms and a more severe infection if you have a weak immune system, are exposed to a lot of the fungus, are a smoker, or have COPD. You may not need treatment if you are diagnosed with this infection but have only a few or mild symptoms. For most people, the infection will go away on its own in a few weeks. This information is not intended to replace advice given to you by your health care provider. Make sure you discuss any questions you have with your health care provider. Document Revised: 09/15/2020 Document Reviewed: 09/15/2020 Elsevier Patient Education  [place].

## 2022-03-05 ENCOUNTER — Telehealth: Payer: Self-pay

## 2022-03-05 LAB — MICROSCOPIC EXAMINATION
Bacteria, UA: NONE SEEN
Casts: NONE SEEN /lpf
Epithelial Cells (non renal): NONE SEEN /hpf (ref 0–10)
WBC, UA: NONE SEEN /hpf (ref 0–5)

## 2022-03-05 LAB — UA/M W/RFLX CULTURE, ROUTINE
Bilirubin, UA: NEGATIVE
Glucose, UA: NEGATIVE
Ketones, UA: NEGATIVE
Leukocytes,UA: NEGATIVE
Nitrite, UA: NEGATIVE
Protein,UA: NEGATIVE
RBC, UA: NEGATIVE
Specific Gravity, UA: 1.014 (ref 1.005–1.030)
Urobilinogen, Ur: 0.2 mg/dL (ref 0.2–1.0)
pH, UA: 7 (ref 5.0–7.5)

## 2022-03-05 NOTE — Telephone Encounter (Signed)
-----   Message from Mylinda Latina, PA-C sent at 03/05/2022  1:09 PM EST ----- Please let him know that no blood was seen on his repeat urine

## 2022-03-05 NOTE — Telephone Encounter (Signed)
Left message for patient regarding urine results.

## 2022-03-07 ENCOUNTER — Ambulatory Visit: Payer: Medicare HMO | Admitting: Internal Medicine

## 2022-04-01 ENCOUNTER — Ambulatory Visit
Admission: RE | Admit: 2022-04-01 | Discharge: 2022-04-01 | Disposition: A | Discharge status: Home / Self Care | Payer: Medicare HMO | Source: Ambulatory Visit | Attending: Internal Medicine | Admitting: Internal Medicine

## 2022-04-01 DIAGNOSIS — J439 Emphysema, unspecified: Secondary | ICD-10-CM | POA: Diagnosis not present

## 2022-04-01 DIAGNOSIS — R911 Solitary pulmonary nodule: Secondary | ICD-10-CM | POA: Insufficient documentation

## 2022-04-05 ENCOUNTER — Other Ambulatory Visit: Payer: Self-pay | Admitting: Physician Assistant

## 2022-04-05 DIAGNOSIS — K2270 Barrett's esophagus without dysplasia: Secondary | ICD-10-CM

## 2022-05-03 ENCOUNTER — Ambulatory Visit: Payer: Medicare HMO | Admitting: Internal Medicine

## 2022-05-16 ENCOUNTER — Encounter: Payer: Self-pay | Admitting: Internal Medicine

## 2022-05-16 ENCOUNTER — Ambulatory Visit: Payer: Medicare HMO | Admitting: Internal Medicine

## 2022-05-16 VITALS — BP 110/90 | HR 76 | Temp 97.9°F | Resp 16 | Ht 72.0 in | Wt 203.4 lb

## 2022-05-16 DIAGNOSIS — R911 Solitary pulmonary nodule: Secondary | ICD-10-CM | POA: Diagnosis not present

## 2022-05-16 DIAGNOSIS — J4489 Other specified chronic obstructive pulmonary disease: Secondary | ICD-10-CM

## 2022-05-16 DIAGNOSIS — R0602 Shortness of breath: Secondary | ICD-10-CM | POA: Diagnosis not present

## 2022-05-16 MED ORDER — WIXELA INHUB 250-50 MCG/ACT IN AEPB: 1.0000 | INHALATION_SPRAY | Freq: Two times a day (BID) | RESPIRATORY_TRACT | 4 refills | Status: DC

## 2022-05-16 NOTE — Progress Notes (Signed)
[facility] [address] [place], [place] [address]  Pulmonary Sleep Medicine   Office Visit Note  Patient Name: Michael Christian DOB: July 23, 1960 MRN [id number]  Date of Service: 05/16/2022  Complaints/HPI: He is hre for follow up after CT for nodules. The current scan shows stable nodules suggesting to repeat a scan in about 3 months. He has no other symptoms. He is on trelegy however it is too expensive for him to be able to afford it.  He has been given samples and we will try to see if we can have the inhalers gotten for him through another method directly through the manufacturer perhaps  Office Spirometry Results: Peak Flow: (!) 4 L/min FEV1: 1.74 liters FVC: 3.64 liters FEV1/FVC: 47.8 % FVC  % Predicted: 71 % FEV % Predicted: 45 % FeF 25-75: 0.55 liters   ROS  General: (-) fever, (-) chills, (-) night sweats, (-) weakness Skin: (-) rashes, (-) itching,. Eyes: (-) visual changes, (-) redness, (-) itching. Nose and Sinuses: (-) nasal stuffiness or itchiness, (-) postnasal drip, (-) nosebleeds, (-) sinus trouble. Mouth and Throat: (-) sore throat, (-) hoarseness. Neck: (-) swollen glands, (-) enlarged thyroid, (-) neck pain. Respiratory: - cough, (-) bloody sputum, - shortness of breath, - wheezing. Cardiovascular: - ankle swelling, (-) chest pain. Lymphatic: (-) lymph node enlargement. Neurologic: (-) numbness, (-) tingling. Psychiatric: (-) anxiety, (-) depression   Current Medication: Outpatient Encounter Medications as of 05/16/2022  Medication Sig   albuterol (PROVENTIL) (2.5 MG/3ML) 0.083% nebulizer solution USE 1 VIAL VIA NEBULIZER EVERY 6 HOURS AS NEEDED FOR WHEEZING OR SHORTNESS OF BREATH   albuterol (VENTOLIN HFA) 108 (90 Base) MCG/ACT inhaler Inhale 2 puffs into the lungs every 6 (six) hours as needed for wheezing or shortness of breath.   atorvastatin (LIPITOR) 20 MG tablet Take 1 tablet (20 mg total) by mouth daily.   clotrimazole-betamethasone  (LOTRISONE) cream Apply 1 application  topically daily.   Fluticasone-Umeclidin-Vilant (TRELEGY ELLIPTA) 100-62.5-25 MCG/INH AEPB Inhale 1 puff into the lungs daily.   omeprazole (PRILOSEC) 20 MG capsule TAKE 1 CAPSULE BY MOUTH EVERY DAY   No facility-administered encounter medications on file as of 05/16/2022.    Surgical History: Past Surgical History:  Procedure Laterality Date   BACK SURGERY     TONSILLECTOMY      Medical History: Past Medical History:  Diagnosis Date   Barrett's esophagus without dysplasia    Cervical disc disorder with radiculopathy, unspecified cervical region    Chronic obstructive pulmonary disease (COPD) (HCC)    COPD (chronic obstructive pulmonary disease) (HCC)    Mixed hyperlipidemia     Family History: Family History  Problem Relation Age of Onset   Alzheimer's disease Father    Lung cancer Brother    Emphysema Brother     Social History: Social History   Socioeconomic History   Marital status: Married    Spouse name: Not on file   Number of children: Not on file   Years of education: Not on file   Highest education level: Not on file  Occupational History   Not on file  Tobacco Use   Smoking status: Former   Smokeless tobacco: Never  Vaping Use   Vaping Use: Never used  Substance and Sexual Activity   Alcohol use: No   Drug use: No   Sexual activity: Not on file  Other Topics Concern   Not on file  Social History Narrative   Not on file   Social Determinants  of Health   Financial Resource Strain: Not on file  Food Insecurity: Not on file  Transportation Needs: Not on file  Physical Activity: Not on file  Stress: Not on file  Social Connections: Not on file  Intimate Partner Violence: Not on file    Vital Signs: Blood pressure (!) 110/90, pulse 76, temperature 97.9 F (36.6 C), resp. rate 16, height 6' (1.829 m), weight 203 lb 6.4 oz (92.3 kg), SpO2 96 %, peak flow (!) 4 L/min.  Examination: General Appearance: The  patient is well-developed, well-nourished, and in no distress. Skin: Gross inspection of skin unremarkable. Head: normocephalic, no gross deformities. Eyes: no gross deformities noted. ENT: ears appear grossly normal no exudates. Neck: Supple. No thyromegaly. No LAD. Respiratory: no rhonchi noted. Cardiovascular: Normal S1 and S2 without murmur or rub. Extremities: No cyanosis. pulses are equal. Neurologic: Alert and oriented. No involuntary movements.  LABS: Recent Results (from the past 2160 hour(s))  CBC w/Diff/Platelet     Status: Abnormal   Collection Time: 02/20/22  8:20 AM  Result Value Ref Range   WBC 8.2 3.4 - 10.8 x10E3/uL   RBC 5.53 4.14 - 5.80 x10E6/uL   Hemoglobin 17.4 13.0 - 17.7 g/dL   Hematocrit 29.5 (H) 62.1 - 51.0 %   MCV 94 79 - 97 fL   MCH 31.5 26.6 - 33.0 pg   MCHC 33.6 31.5 - 35.7 g/dL   RDW 30.8 65.7 - 84.6 %   Platelets 322 150 - 450 x10E3/uL   Neutrophils 63 Not Estab. %   Lymphs 28 Not Estab. %   Monocytes 8 Not Estab. %   Eos 1 Not Estab. %   Basos 0 Not Estab. %   Neutrophils Absolute 5.1 1.4 - 7.0 x10E3/uL   Lymphocytes Absolute 2.3 0.7 - 3.1 x10E3/uL   Monocytes Absolute 0.6 0.1 - 0.9 x10E3/uL   EOS (ABSOLUTE) 0.1 0.0 - 0.4 x10E3/uL   Basophils Absolute 0.0 0.0 - 0.2 x10E3/uL   Immature Granulocytes 0 Not Estab. %   Immature Grans (Abs) 0.0 0.0 - 0.1 x10E3/uL  Comprehensive metabolic panel     Status: Abnormal   Collection Time: 02/20/22  8:20 AM  Result Value Ref Range   Glucose 115 (H) 70 - 99 mg/dL   BUN 14 8 - 27 mg/dL   Creatinine, Ser 9.62 0.76 - 1.27 mg/dL   eGFR 952 >84 XL/KGM/0.10   BUN/Creatinine Ratio 17 10 - 24   Sodium 141 134 - 144 mmol/L   Potassium 5.0 3.5 - 5.2 mmol/L   Chloride 101 96 - 106 mmol/L   CO2 24 20 - 29 mmol/L   Calcium 9.7 8.6 - 10.2 mg/dL   Total Protein 7.0 6.0 - 8.5 g/dL   Albumin 4.5 3.8 - 4.9 g/dL   Globulin, Total 2.5 1.5 - 4.5 g/dL   Albumin/Globulin Ratio 1.8 1.2 - 2.2   Bilirubin Total 0.5 0.0 -  1.2 mg/dL   Alkaline Phosphatase 100 44 - 121 IU/L   AST 14 0 - 40 IU/L   ALT 13 0 - 44 IU/L  Lipid Panel With LDL/HDL Ratio     Status: Abnormal   Collection Time: 02/20/22  8:20 AM  Result Value Ref Range   Cholesterol, Total 141 100 - 199 mg/dL   Triglycerides 272 0 - 149 mg/dL   HDL 38 (L) >53 mg/dL   VLDL Cholesterol Cal 21 5 - 40 mg/dL   LDL Chol Calc (NIH) 82 0 - 99 mg/dL   LDL/HDL  Ratio 2.2 0.0 - 3.6 ratio    Comment:                                     LDL/HDL Ratio                                             Men  Women                               1/2 Avg.Risk  1.0    1.5                                   Avg.Risk  3.6    3.2                                2X Avg.Risk  6.2    5.0                                3X Avg.Risk  8.0    6.1   TSH + free T4     Status: None   Collection Time: 02/20/22  8:20 AM  Result Value Ref Range   TSH 1.200 0.450 - 4.500 uIU/mL   Free T4 1.46 0.82 - 1.77 ng/dL  PSA Total (Reflex To Free)     Status: None   Collection Time: 02/20/22  8:20 AM  Result Value Ref Range   Prostate Specific Ag, Serum 0.5 0.0 - 4.0 ng/mL    Comment: Roche ECLIA methodology. According to the American Urological Association, Serum PSA should decrease and remain at undetectable levels after radical prostatectomy. The AUA defines biochemical recurrence as an initial PSA value 0.2 ng/mL or greater followed by a subsequent confirmatory PSA value 0.2 ng/mL or greater. Values obtained with different assay methods or kits cannot be used interchangeably. Results cannot be interpreted as absolute evidence of the presence or absence of malignant disease.    Reflex Criteria Comment     Comment: The percent free PSA is performed on a reflex basis only when the total PSA is between 4.0 and 10.0 ng/mL.   VITAMIN D 25 Hydroxy (Vit-D Deficiency, Fractures)     Status: Abnormal   Collection Time: 02/20/22  8:20 AM  Result Value Ref Range   Vit D, 25-Hydroxy 23.4 (L) 30.0 -  100.0 ng/mL    Comment: Vitamin D deficiency has been defined by the Institute of Medicine and an Endocrine Society practice guideline as a level of serum 25-OH vitamin D less than 20 ng/mL (1,2). The Endocrine Society went on to further define vitamin D insufficiency as a level between 21 and 29 ng/mL (2). 1. IOM (Institute of Medicine). 2010. Dietary reference    intakes for calcium and D. [place]: The    [employer]. 2. Holick MF, Binkley Burchinal, Bischoff-Ferrari HA, et al.    Evaluation, treatment, and prevention of vitamin D    deficiency: an Endocrine Society clinical practice    guideline. JCEM. 2011 Jul; 96(7):1911-30.   UA/M w/rflx Culture, Routine  Status: None   Collection Time: 03/04/22  2:46 PM   Specimen: Urine   Urine  Result Value Ref Range   Specific Gravity, UA 1.014 1.005 - 1.030   pH, UA 7.0 5.0 - 7.5   Color, UA Yellow Yellow   Appearance Ur Clear Clear   Leukocytes,UA Negative Negative   Protein,UA Negative Negative/Trace   Glucose, UA Negative Negative   Ketones, UA Negative Negative   RBC, UA Negative Negative   Bilirubin, UA Negative Negative   Urobilinogen, Ur 0.2 0.2 - 1.0 mg/dL   Nitrite, UA Negative Negative   Microscopic Examination Comment     Comment: Microscopic follows if indicated.   Microscopic Examination See below:     Comment: Microscopic was indicated and was performed.   Urinalysis Reflex Comment     Comment: This specimen will not reflex to a Urine Culture.  Microscopic Examination     Status: None   Collection Time: 03/04/22  2:46 PM   Urine  Result Value Ref Range   WBC, UA None seen 0 - 5 /hpf   RBC, Urine 0-2 0 - 2 /hpf   Epithelial Cells (non renal) None seen 0 - 10 /hpf   Casts None seen None seen /lpf   Bacteria, UA None seen None seen/Few    Radiology: CT Chest High Resolution  Result Date: 04/03/2022 CLINICAL DATA:  Lung nodules former smoker, quit 2010. EXAM: CT CHEST WITHOUT CONTRAST TECHNIQUE:  Multidetector CT imaging of the chest was performed following the standard protocol without intravenous contrast. High resolution imaging of the lungs, as well as inspiratory and expiratory imaging, was performed. RADIATION DOSE REDUCTION: This exam was performed according to the departmental dose-optimization program which includes automated exposure control, adjustment of the mA and/or kV according to patient size and/or use of iterative reconstruction technique. COMPARISON:  02/26/2022. FINDINGS: Cardiovascular: Atherosclerotic calcification of the aorta and coronary arteries. Heart size normal. No pericardial effusion. Enlarged pulmonic trunk. Mediastinum/Nodes: No pathologically enlarged mediastinal or axillary lymph nodes. Hilar regions are difficult to definitively evaluate without IV contrast. Esophagus is grossly unremarkable. Lungs/Pleura: Severe centrilobular and paraseptal emphysema. No subpleural reticulation, traction bronchiectasis/bronchiolectasis, ground-glass, architectural distortion or honeycombing. Scattered pulmonary nodules and peribronchovascular nodular densities measure up to 0.6 x 2.5 cm in the left lower lobe (8/134), as on 02/26/2022. No pleural fluid. Airway is unremarkable. No air trapping. Upper Abdomen: Visualized portions of the liver, gallbladder, adrenal glands and right kidney are unremarkable. Tiny left renal stone. Visualized portions of the spleen, pancreas, stomach and bowel are grossly unremarkable. No upper abdominal adenopathy. Musculoskeletal: Degenerative changes in the spine. No worrisome lytic or sclerotic lesions. IMPRESSION: 1. No evidence of interstitial lung disease. 2. Scattered pulmonary nodules and peribronchovascular nodular densities, measuring up to 0.6 x 2.5 cm in the left lower lobe, as on 02/26/2022. Recommend follow-up CT chest without contrast in 3 months in further evaluation, as malignancy cannot be excluded. 3. Tiny left renal stone. 4. Aortic  atherosclerosis (ICD10-I70.0). Coronary artery calcification. 5. Enlarged pulmonic trunk, indicative of pulmonary arterial hypertension. 6.  Emphysema (ICD10-J43.9). Electronically Signed   By: Leanna Battles M.D.   On: 04/03/2022 09:07    No results found.  No results found.  Assessment and Plan: Patient Active Problem List   Diagnosis Date Noted   Encounter for general adult medical examination with abnormal findings 11/16/2018   Dysuria 11/16/2018   Obstructive chronic bronchitis without exacerbation 05/11/2018   Chronic obstructive pulmonary disease with acute exacerbation (HCC) 05/11/2018  Cervical disc disorder with radiculopathy, unspecified cervical region 01/31/2017   Emphysema, unspecified (HCC) 01/31/2017   COPD (chronic obstructive pulmonary disease) with emphysema (HCC) 01/31/2017   Mixed hyperlipidemia 01/31/2017   Barrett's esophagus without dysplasia 01/31/2017   1. SOB (shortness of breath) Follow-up spirometry done today reviewed results with the patient - Spirometry with graph  2. Pulmonary nodule seen on imaging study Patient has nodule history of tobacco use the plan is going to be to follow-up the nodule per recommendations  3. 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  Dispense: 180 each; Refill: 4  4. Solitary pulmonary nodule  - CT Chest High Resolution; Future    General Counseling: I have discussed the findings of the evaluation and examination with Evonte.  I have also discussed any further diagnostic evaluation thatmay be needed or ordered today. Jermani verbalizes understanding of the findings of todays visit. We also reviewed his medications today and discussed drug interactions and side effects including but not limited excessive drowsiness and altered mental states. We also discussed that there is always a risk not  just to him but also people around him. he has been encouraged to call the office with any questions or concerns that should arise related to todays visit.  Orders Placed This Encounter  Procedures   Spirometry with graph    Order Specific Question:   Where should this test be performed?    Answer:   [facility]     Time spent: 86  I have personally obtained a history, examined the patient, evaluated laboratory and imaging results, formulated the assessment and plan and placed orders.    Yevonne Pax, MD [facility] Pulmonary and Critical Care Sleep medicine

## 2022-05-16 NOTE — Patient Instructions (Signed)
Chronic Obstructive Pulmonary Disease  Chronic obstructive pulmonary disease (COPD) is a long-term (chronic) lung problem. When you have COPD, it is hard for air to get in and out of your lungs. Usually the condition gets worse over time, and your lungs will never return to normal. There are things you can do to keep yourself as healthy as possible. What are the causes? Smoking. This is the most common cause. Certain genes passed from parent to child (inherited). What increases the risk? Being exposed to secondhand smoke from cigarettes, pipes, or cigars. Being exposed to chemicals and other irritants, such as fumes and dust in the work environment. Having chronic lung conditions or infections. What are the signs or symptoms? Shortness of breath, especially during physical activity. A long-term cough with a large amount of thick mucus. Sometimes, the cough may not have any mucus (dry cough). Wheezing. Breathing quickly. Skin that looks gray or blue, especially in the fingers, toes, or lips. Feeling tired (fatigue). Weight loss. Chest tightness. Having infections often. Episodes when breathing symptoms become much worse (exacerbations). At the later stages of this disease, you may have swelling in the ankles, feet, or legs. How is this treated? Taking medicines. Quitting smoking, if you smoke. Rehabilitation. This includes steps to make your body work better. It may involve a team of specialists. Doing exercises. Making changes to your diet. Using oxygen. Lung surgery. Lung transplant. Comfort measures (palliative care). Follow these instructions at home: Medicines Take over-the-counter and prescription medicines only as told by your doctor. Talk to your doctor before taking any cough or allergy medicines. You may need to avoid medicines that cause your lungs to be dry. Lifestyle If you smoke, stop smoking. Smoking makes the problem worse. Do not smoke or use any products that  contain nicotine or tobacco. If you need help quitting, ask your doctor. Avoid being around things that make your breathing worse. This may include smoke, chemicals, and fumes. Stay active, but remember to rest as well. Learn and use tips on how to manage stress and control your breathing. Make sure you get enough sleep. Most adults need at least 7 hours of sleep every night. Eat healthy foods. Eat smaller meals more often. Rest before meals. Controlled breathing Learn and use tips on how to control your breathing as told by your doctor. Try: Breathing in (inhaling) through your nose for 1 second. Then, pucker your lips and breath out (exhale) through your lips for 2 seconds. Putting one hand on your belly (abdomen). Breathe in slowly through your nose for 1 second. Your hand on your belly should move out. Pucker your lips and breathe out slowly through your lips. Your hand on your belly should move in as you breathe out.  Controlled coughing Learn and use controlled coughing to clear mucus from your lungs. Follow these steps: Lean your head a little forward. Breathe in deeply. Try to hold your breath for 3 seconds. Keep your mouth slightly open while coughing 2 times. Spit any mucus out into a tissue. Rest and do the steps again 1 or 2 times as needed. General instructions Make sure you get all the shots (vaccines) that your doctor recommends. Ask your doctor about a flu shot and a pneumonia shot. Use oxygen therapy and pulmonary rehabilitation if told by your doctor. If you need home oxygen therapy, ask your doctor if you should buy a tool to measure your oxygen level (oximeter). Make a COPD action plan with your doctor. This helps you   to know what to do if you feel worse than usual. Manage any other conditions you have as told by your doctor. Avoid going outside when it is very hot, cold, or humid. Avoid people who have a sickness you can catch (contagious). Keep all follow-up  visits. Contact a doctor if: You cough up more mucus than usual. There is a change in the color or thickness of the mucus. It is harder to breathe than usual. Your breathing is faster than usual. You have trouble sleeping. You need to use your medicines more often than usual. You have trouble doing your normal activities such as getting dressed or walking around the house. Get help right away if: You have shortness of breath while resting. You have shortness of breath that stops you from: Being able to talk. Doing normal activities. Your chest hurts for longer than 5 minutes. Your skin color is more blue than usual. Your pulse oximeter shows that you have low oxygen for longer than 5 minutes. You have a fever. You feel too tired to breathe normally. These symptoms may represent a serious problem that is an emergency. Do not wait to see if the symptoms will go away. Get medical help right away. Call your local emergency services (911 in the [place] Do not drive yourself to the hospital. Summary Chronic obstructive pulmonary disease (COPD) is a long-term lung problem. The way your lungs work will never return to normal. Usually the condition gets worse over time. There are things you can do to keep yourself as healthy as possible. Take over-the-counter and prescription medicines only as told by your doctor. If you smoke, stop. Smoking makes the problem worse. This information is not intended to replace advice given to you by your health care provider. Make sure you discuss any questions you have with your health care provider. Document Revised: 11/09/2019 Document Reviewed: 11/09/2019 Elsevier Patient Education  2023 [employer] Inc.  

## 2022-07-30 ENCOUNTER — Telehealth: Payer: Self-pay | Admitting: Internal Medicine

## 2022-07-30 NOTE — Telephone Encounter (Signed)
Lvm to schedule pft-Toni 

## 2022-08-08 ENCOUNTER — Telehealth: Payer: Self-pay | Admitting: Internal Medicine

## 2022-08-08 NOTE — Telephone Encounter (Signed)
Lvm notifying patient of CT appointment date, arrival time, location-Toni

## 2022-08-09 ENCOUNTER — Encounter: Payer: Self-pay | Admitting: Physician Assistant

## 2022-08-09 ENCOUNTER — Ambulatory Visit (INDEPENDENT_AMBULATORY_CARE_PROVIDER_SITE_OTHER): Payer: Medicare HMO | Admitting: Physician Assistant

## 2022-08-09 VITALS — BP 130/80 | HR 96 | Temp 98.0°F | Resp 16 | Ht 72.0 in | Wt 198.6 lb

## 2022-08-09 DIAGNOSIS — E782 Mixed hyperlipidemia: Secondary | ICD-10-CM

## 2022-08-09 DIAGNOSIS — J4489 Other specified chronic obstructive pulmonary disease: Secondary | ICD-10-CM | POA: Diagnosis not present

## 2022-08-09 NOTE — Progress Notes (Signed)
[facility] [address] [place], [place] [address]  Internal MEDICINE  Office Visit Note  Patient Name: Michael Christian  [id number]  [id number]  Date of Service: 08/21/2022  Chief Complaint  Patient presents with   Follow-up   Hyperlipidemia    HPI Pt is here for routine follow up -Doing well with new wixela inhaler, breathing stable. Followed by pulm now and has repeat CT chest soon -BP stable -Good appetite, swimming daily -sleeping well -Taking Vit D -still declines shingles vaccines  Current Medication: Outpatient Encounter Medications as of 08/09/2022  Medication Sig   albuterol (VENTOLIN HFA) 108 (90 Base) MCG/ACT inhaler Inhale 2 puffs into the lungs every 6 (six) hours as needed for wheezing or shortness of breath.   atorvastatin (LIPITOR) 20 MG tablet Take 1 tablet (20 mg total) by mouth daily.   clotrimazole-betamethasone (LOTRISONE) cream Apply 1 application  topically daily.   fluticasone-salmeterol (WIXELA INHUB) 250-50 MCG/ACT AEPB Inhale 1 puff into the lungs in the morning and at bedtime.   omeprazole (PRILOSEC) 20 MG capsule TAKE 1 CAPSULE BY MOUTH EVERY DAY   [DISCONTINUED] albuterol (PROVENTIL) (2.5 MG/3ML) 0.083% nebulizer solution USE 1 VIAL VIA NEBULIZER EVERY 6 HOURS AS NEEDED FOR WHEEZING OR SHORTNESS OF BREATH   [DISCONTINUED] Fluticasone-Umeclidin-Vilant (TRELEGY ELLIPTA) 100-62.5-25 MCG/INH AEPB Inhale 1 puff into the lungs daily.   No facility-administered encounter medications on file as of 08/09/2022.    Surgical History: Past Surgical History:  Procedure Laterality Date   BACK SURGERY     TONSILLECTOMY      Medical History: Past Medical History:  Diagnosis Date   Barrett's esophagus without dysplasia    Cervical disc disorder with radiculopathy, unspecified cervical region    Chronic obstructive pulmonary disease (COPD) (HCC)    COPD (chronic obstructive pulmonary disease) (HCC)    Mixed hyperlipidemia     Family  History: Family History  Problem Relation Age of Onset   Alzheimer's disease Father    Lung cancer Brother    Emphysema Brother     Social History   Socioeconomic History   Marital status: Married    Spouse name: Not on file   Number of children: Not on file   Years of education: Not on file   Highest education level: Not on file  Occupational History   Not on file  Tobacco Use   Smoking status: Former   Smokeless tobacco: Never  Vaping Use   Vaping status: Never Used  Substance and Sexual Activity   Alcohol use: No   Drug use: No   Sexual activity: Not on file  Other Topics Concern   Not on file  Social History Narrative   Not on file   Social Determinants of Health   Financial Resource Strain: Not on file  Food Insecurity: Not on file  Transportation Needs: Not on file  Physical Activity: Not on file  Stress: Not on file  Social Connections: Not on file  Intimate Partner Violence: Not on file      Review of Systems  Constitutional:  Negative for chills, fatigue and unexpected weight change.  HENT:  Negative for congestion, postnasal drip, rhinorrhea, sneezing and sore throat.   Eyes:  Negative for redness.  Respiratory:  Negative for cough, chest tightness and wheezing.   Cardiovascular:  Negative for chest pain and palpitations.  Gastrointestinal:  Negative for abdominal pain, constipation, diarrhea, nausea and vomiting.  Genitourinary:  Negative for dysuria and frequency.  Musculoskeletal:  Negative for arthralgias, back  pain, joint swelling and neck pain.  Skin:  Negative for rash.  Neurological: Negative.  Negative for tremors and numbness.  Hematological:  Negative for adenopathy. Does not bruise/bleed easily.  Psychiatric/Behavioral:  Negative for behavioral problems (Depression), sleep disturbance and suicidal ideas. The patient is not nervous/anxious.     Vital Signs: BP 130/80   Pulse 96   Temp 98 F (36.7 C)   Resp 16   Ht 6' (1.829 m)    Wt 198 lb 9.6 oz (90.1 kg)   SpO2 (!) 79%   BMI 26.94 kg/m    Physical Exam Constitutional:      Appearance: Normal appearance. He is normal weight.  HENT:     Head: Normocephalic and atraumatic.     Right Ear: Tympanic membrane normal.     Left Ear: Tympanic membrane normal.     Nose: Nose normal.     Mouth/Throat:     Mouth: Mucous membranes are moist.     Pharynx: Oropharynx is clear.  Eyes:     Pupils: Pupils are equal, round, and reactive to light.  Cardiovascular:     Rate and Rhythm: Normal rate and regular rhythm.     Pulses: Normal pulses.     Heart sounds: Normal heart sounds.  Pulmonary:     Effort: Pulmonary effort is normal.     Breath sounds: Normal breath sounds.  Abdominal:     General: Abdomen is flat.     Tenderness: There is no abdominal tenderness.  Musculoskeletal:        General: Normal range of motion.     Cervical back: Normal range of motion.     Right lower leg: No edema.     Left lower leg: No edema.  Skin:    General: Skin is warm.  Neurological:     General: No focal deficit present.     Mental Status: He is alert and oriented to person, place, and time. Mental status is at baseline.  Psychiatric:        Mood and Affect: Mood normal.        Behavior: Behavior normal.        Thought Content: Thought content normal.        Judgment: Judgment normal.        Assessment/Plan: 1. Mixed hyperlipidemia Continue lipitor as before  2. Obstructive chronic bronchitis without exacerbation Continue Wixela, Followed by pulmonology   General Counseling: Taydon verbalizes understanding of the findings of todays visit and agrees with plan of treatment. I have discussed any further diagnostic evaluation that may be needed or ordered today. We also reviewed his medications today. he has been encouraged to call the office with any questions or concerns that should arise related to todays visit.    No orders of the defined types were placed in  this encounter.   No orders of the defined types were placed in this encounter.   This patient was seen by Lynn Ito, PA-C in collaboration with Dr. Beverely Risen as a part of collaborative care agreement.   Total time spent:30 Minutes Time spent includes review of chart, medications, test results, and follow up plan with the patient.      Dr Lyndon Code Internal medicine

## 2022-08-12 ENCOUNTER — Ambulatory Visit
Admission: RE | Admit: 2022-08-12 | Discharge: 2022-08-12 | Disposition: A | Discharge status: Home / Self Care | Payer: Medicare HMO | Source: Ambulatory Visit | Attending: Internal Medicine | Admitting: Internal Medicine

## 2022-08-12 DIAGNOSIS — R911 Solitary pulmonary nodule: Secondary | ICD-10-CM | POA: Insufficient documentation

## 2022-08-12 DIAGNOSIS — J432 Centrilobular emphysema: Secondary | ICD-10-CM | POA: Diagnosis not present

## 2022-08-12 DIAGNOSIS — R918 Other nonspecific abnormal finding of lung field: Secondary | ICD-10-CM | POA: Diagnosis not present

## 2022-08-12 DIAGNOSIS — I700 Atherosclerosis of aorta: Secondary | ICD-10-CM | POA: Diagnosis not present

## 2022-08-14 ENCOUNTER — Other Ambulatory Visit: Payer: Self-pay | Admitting: Physician Assistant

## 2022-08-14 DIAGNOSIS — J4489 Other specified chronic obstructive pulmonary disease: Secondary | ICD-10-CM

## 2022-08-19 ENCOUNTER — Encounter: Payer: Self-pay | Admitting: Internal Medicine

## 2022-08-19 ENCOUNTER — Ambulatory Visit: Payer: Medicare HMO | Admitting: Internal Medicine

## 2022-08-19 VITALS — BP 128/82 | HR 74 | Temp 97.8°F | Resp 16 | Ht 72.0 in | Wt 197.0 lb

## 2022-08-19 DIAGNOSIS — J849 Interstitial pulmonary disease, unspecified: Secondary | ICD-10-CM

## 2022-08-19 DIAGNOSIS — R911 Solitary pulmonary nodule: Secondary | ICD-10-CM

## 2022-08-19 DIAGNOSIS — J4489 Other specified chronic obstructive pulmonary disease: Secondary | ICD-10-CM

## 2022-08-19 NOTE — Patient Instructions (Signed)
Chronic Obstructive Pulmonary Disease  Chronic obstructive pulmonary disease (COPD) is a long-term (chronic) lung problem. When you have COPD, it is hard for air to get in and out of your lungs. Usually the condition gets worse over time, and your lungs will never return to normal. There are things you can do to keep yourself as healthy as possible. What are the causes? Smoking. This is the most common cause. Certain genes passed from parent to child (inherited). What increases the risk? Being exposed to secondhand smoke from cigarettes, pipes, or cigars. Being exposed to chemicals and other irritants, such as fumes and dust in the work environment. Having chronic lung conditions or infections. What are the signs or symptoms? Shortness of breath, especially during physical activity. A long-term cough with a large amount of thick mucus. Sometimes, the cough may not have any mucus (dry cough). Wheezing. Breathing quickly. Skin that looks gray or blue, especially in the fingers, toes, or lips. Feeling tired (fatigue). Weight loss. Chest tightness. Having infections often. Episodes when breathing symptoms become much worse (exacerbations). At the later stages of this disease, you may have swelling in the ankles, feet, or legs. How is this treated? Taking medicines. Quitting smoking, if you smoke. Rehabilitation. This includes steps to make your body work better. It may involve a team of specialists. Doing exercises. Making changes to your diet. Using oxygen. Lung surgery. Lung transplant. Comfort measures (palliative care). Follow these instructions at home: Medicines Take over-the-counter and prescription medicines only as told by your doctor. Talk to your doctor before taking any cough or allergy medicines. You may need to avoid medicines that cause your lungs to be dry. Lifestyle If you smoke, stop smoking. Smoking makes the problem worse. Do not smoke or use any products that  contain nicotine or tobacco. If you need help quitting, ask your doctor. Avoid being around things that make your breathing worse. This may include smoke, chemicals, and fumes. Stay active, but remember to rest as well. Learn and use tips on how to manage stress and control your breathing. Make sure you get enough sleep. Most adults need at least 7 hours of sleep every night. Eat healthy foods. Eat smaller meals more often. Rest before meals. Controlled breathing Learn and use tips on how to control your breathing as told by your doctor. Try: Breathing in (inhaling) through your nose for 1 second. Then, pucker your lips and breath out (exhale) through your lips for 2 seconds. Putting one hand on your belly (abdomen). Breathe in slowly through your nose for 1 second. Your hand on your belly should move out. Pucker your lips and breathe out slowly through your lips. Your hand on your belly should move in as you breathe out.  Controlled coughing Learn and use controlled coughing to clear mucus from your lungs. Follow these steps: Lean your head a little forward. Breathe in deeply. Try to hold your breath for 3 seconds. Keep your mouth slightly open while coughing 2 times. Spit any mucus out into a tissue. Rest and do the steps again 1 or 2 times as needed. General instructions Make sure you get all the shots (vaccines) that your doctor recommends. Ask your doctor about a flu shot and a pneumonia shot. Use oxygen therapy and pulmonary rehabilitation if told by your doctor. If you need home oxygen therapy, ask your doctor if you should buy a tool to measure your oxygen level (oximeter). Make a COPD action plan with your doctor. This helps you   to know what to do if you feel worse than usual. Manage any other conditions you have as told by your doctor. Avoid going outside when it is very hot, cold, or humid. Avoid people who have a sickness you can catch (contagious). Keep all follow-up  visits. Contact a doctor if: You cough up more mucus than usual. There is a change in the color or thickness of the mucus. It is harder to breathe than usual. Your breathing is faster than usual. You have trouble sleeping. You need to use your medicines more often than usual. You have trouble doing your normal activities such as getting dressed or walking around the house. Get help right away if: You have shortness of breath while resting. You have shortness of breath that stops you from: Being able to talk. Doing normal activities. Your chest hurts for longer than 5 minutes. Your skin color is more blue than usual. Your pulse oximeter shows that you have low oxygen for longer than 5 minutes. You have a fever. You feel too tired to breathe normally. These symptoms may represent a serious problem that is an emergency. Do not wait to see if the symptoms will go away. Get medical help right away. Call your local emergency services (911 in the [place] Do not drive yourself to the hospital. Summary Chronic obstructive pulmonary disease (COPD) is a long-term lung problem. The way your lungs work will never return to normal. Usually the condition gets worse over time. There are things you can do to keep yourself as healthy as possible. Take over-the-counter and prescription medicines only as told by your doctor. If you smoke, stop. Smoking makes the problem worse. This information is not intended to replace advice given to you by your health care provider. Make sure you discuss any questions you have with your health care provider. Document Revised: 11/08/2019 Document Reviewed: 11/09/2019 Elsevier Patient Education  2024 [employer] Inc[employer]  

## 2022-08-19 NOTE — Progress Notes (Signed)
[facility] [address] [place], [place] [address]  Pulmonary Sleep Medicine   Office Visit Note  Patient Name: Michael Christian DOB: [birth year] MRN [id number]  Date of Service: 08/19/2022  Complaints/HPI: He is not doing well with the wexilla. He states he was doing better with the trelegy but it is too expensive. He had CT chest done report is not available but I did review the scan shows extensive disease not much different from last films. States he is having SOB and also has been having sputum production. I have given him samples of the trelegy. He is going to call his insurance to see what is covered  Office Spirometry Results:     ROS  General: (-) fever, (-) chills, (-) night sweats, (-) weakness Skin: (-) rashes, (-) itching,. Eyes: (-) visual changes, (-) redness, (-) itching. Nose and Sinuses: (-) nasal stuffiness or itchiness, (-) postnasal drip, (-) nosebleeds, (-) sinus trouble. Mouth and Throat: (-) sore throat, (-) hoarseness. Neck: (-) swollen glands, (-) enlarged thyroid, (-) neck pain. Respiratory: + cough, (-) bloody sputum, + shortness of breath, - wheezing. Cardiovascular: - ankle swelling, (-) chest pain. Lymphatic: (-) lymph node enlargement. Neurologic: (-) numbness, (-) tingling. Psychiatric: (-) anxiety, (-) depression   Current Medication: Outpatient Encounter Medications as of 08/19/2022  Medication Sig   albuterol (PROVENTIL) (2.5 MG/3ML) 0.083% nebulizer solution USE 1 VIAL VIA NEBULIZER EVERY 6 HOURS AS NEEDED FOR WHEEZING OR SHORTNESS OF BREATH   albuterol (VENTOLIN HFA) 108 (90 Base) MCG/ACT inhaler Inhale 2 puffs into the lungs every 6 (six) hours as needed for wheezing or shortness of breath.   atorvastatin (LIPITOR) 20 MG tablet Take 1 tablet (20 mg total) by mouth daily.   clotrimazole-betamethasone (LOTRISONE) cream Apply 1 application  topically daily.   fluticasone-salmeterol (WIXELA INHUB) 250-50 MCG/ACT AEPB Inhale 1 puff  into the lungs in the morning and at bedtime.   omeprazole (PRILOSEC) 20 MG capsule TAKE 1 CAPSULE BY MOUTH EVERY DAY   No facility-administered encounter medications on file as of 08/19/2022.    Surgical History: Past Surgical History:  Procedure Laterality Date   BACK SURGERY     TONSILLECTOMY      Medical History: Past Medical History:  Diagnosis Date   Barrett's esophagus without dysplasia    Cervical disc disorder with radiculopathy, unspecified cervical region    Chronic obstructive pulmonary disease (COPD) (HCC)    COPD (chronic obstructive pulmonary disease) (HCC)    Mixed hyperlipidemia     Family History: Family History  Problem Relation Age of Onset   Alzheimer's disease Father    Lung cancer Brother    Emphysema Brother     Social History: Social History   Socioeconomic History   Marital status: Married    Spouse name: Not on file   Number of children: Not on file   Years of education: Not on file   Highest education level: Not on file  Occupational History   Not on file  Tobacco Use   Smoking status: Former   Smokeless tobacco: Never  Vaping Use   Vaping status: Never Used  Substance and Sexual Activity   Alcohol use: No   Drug use: No   Sexual activity: Not on file  Other Topics Concern   Not on file  Social History Narrative   Not on file   Social Determinants of Health   Financial Resource Strain: Not on file  Food Insecurity: Not on file  Transportation Needs: Not  on file  Physical Activity: Not on file  Stress: Not on file  Social Connections: Not on file  Intimate Partner Violence: Not on file    Vital Signs: Blood pressure 128/82, pulse 74, temperature 97.8 F (36.6 C), resp. rate 16, height 6' (1.829 m), weight 197 lb (89.4 kg), SpO2 94%.  Examination: General Appearance: The patient is well-developed, well-nourished, and in no distress. Skin: Gross inspection of skin unremarkable. Head: normocephalic, no gross  deformities. Eyes: no gross deformities noted. ENT: ears appear grossly normal no exudates. Neck: Supple. No thyromegaly. No LAD. Respiratory: no rhonchi noted. Cardiovascular: Normal S1 and S2 without murmur or rub. Extremities: No cyanosis. pulses are equal. Neurologic: Alert and oriented. No involuntary movements.  LABS: No results found for this or any previous visit (from the past 2160 hour(s)).  Radiology: No results found.  No results found.  No results found.  Assessment and Plan: Patient Active Problem List   Diagnosis Date Noted   Encounter for general adult medical examination with abnormal findings 11/16/2018   Dysuria 11/16/2018   Obstructive chronic bronchitis without exacerbation 05/11/2018   Chronic obstructive pulmonary disease with acute exacerbation (HCC) 05/11/2018   Cervical disc disorder with radiculopathy, unspecified cervical region 01/31/2017   Emphysema, unspecified (HCC) 01/31/2017   COPD (chronic obstructive pulmonary disease) with emphysema (HCC) 01/31/2017   Mixed hyperlipidemia 01/31/2017   Barrett's esophagus without dysplasia 01/31/2017    1. Obstructive chronic bronchitis without exacerbation Unfortunately the medication that he gets response from is not covered by his insurance.  Patient stated that he will check with his insurance if they can do an exception or to find out what else is covered.  In the meantime I have given him samples of Trelegy.  2. Pulmonary nodule seen on imaging study CT images reviewed we will get a follow-up CT in 6 months or so to reassess any improvement.  3. ILD (interstitial lung disease) (HCC) As above we will get follow-up CT images to see if there is any progression or improvement.  Patient may also have atypical infection which should be noted  General Counseling: I have discussed the findings of the evaluation and examination with Michael Christian.  I have also discussed any further diagnostic evaluation thatmay be  needed or ordered today. Michael Christian verbalizes understanding of the findings of todays visit. We also reviewed his medications today and discussed drug interactions and side effects including but not limited excessive drowsiness and altered mental states. We also discussed that there is always a risk not just to him but also people around him. he has been encouraged to call the office with any questions or concerns that should arise related to todays visit.  No orders of the defined types were placed in this encounter.    Time spent: 74  I have personally obtained a history, examined the patient, evaluated laboratory and imaging results, formulated the assessment and plan and placed orders.    Yevonne Pax, MD [facility] Sleep medicine

## 2022-08-21 ENCOUNTER — Encounter: Payer: Self-pay | Admitting: Internal Medicine

## 2022-09-13 ENCOUNTER — Other Ambulatory Visit: Payer: Self-pay

## 2022-09-13 ENCOUNTER — Encounter: Payer: Self-pay | Admitting: Internal Medicine

## 2022-09-25 ENCOUNTER — Telehealth: Payer: Self-pay

## 2022-09-25 ENCOUNTER — Other Ambulatory Visit: Payer: Self-pay

## 2022-09-26 ENCOUNTER — Other Ambulatory Visit: Payer: Self-pay

## 2022-09-26 MED ORDER — TRELEGY ELLIPTA 100-62.5-25 MCG/ACT IN AEPB: INHALATION_SPRAY | RESPIRATORY_TRACT | 1 refills | Status: DC

## 2022-09-26 NOTE — Telephone Encounter (Signed)
Send message to [employer]

## 2022-09-27 ENCOUNTER — Telehealth: Payer: Self-pay

## 2022-09-27 NOTE — Telephone Encounter (Signed)
Faxed Trellegy request to [employer] along with chart notes.

## 2022-10-02 ENCOUNTER — Telehealth: Payer: Self-pay

## 2022-10-02 NOTE — Telephone Encounter (Signed)
Error

## 2022-10-03 ENCOUNTER — Other Ambulatory Visit: Payer: Self-pay

## 2022-10-03 MED ORDER — TRELEGY ELLIPTA 100-62.5-25 MCG/ACT IN AEPB: INHALATION_SPRAY | RESPIRATORY_TRACT | 1 refills | Status: DC

## 2022-10-03 NOTE — Telephone Encounter (Signed)
Patient approved for Trelegy, notified Dawn, patient's wife.

## 2022-10-15 ENCOUNTER — Other Ambulatory Visit: Payer: Self-pay | Admitting: Physician Assistant

## 2022-10-15 DIAGNOSIS — J4489 Other specified chronic obstructive pulmonary disease: Secondary | ICD-10-CM

## 2022-11-15 ENCOUNTER — Encounter: Payer: Self-pay | Admitting: Internal Medicine

## 2022-11-25 ENCOUNTER — Other Ambulatory Visit: Payer: Self-pay | Admitting: Physician Assistant

## 2022-11-25 DIAGNOSIS — J4489 Other specified chronic obstructive pulmonary disease: Secondary | ICD-10-CM

## 2023-01-11 ENCOUNTER — Other Ambulatory Visit: Payer: Self-pay | Admitting: Physician Assistant

## 2023-01-11 DIAGNOSIS — K2270 Barrett's esophagus without dysplasia: Secondary | ICD-10-CM

## 2023-01-21 ENCOUNTER — Encounter: Payer: Self-pay | Admitting: Physician Assistant

## 2023-01-21 ENCOUNTER — Other Ambulatory Visit: Payer: Self-pay

## 2023-01-21 MED ORDER — TRELEGY ELLIPTA 100-62.5-25 MCG/ACT IN AEPB: INHALATION_SPRAY | RESPIRATORY_TRACT | 1 refills | Status: DC

## 2023-01-23 ENCOUNTER — Other Ambulatory Visit: Payer: Self-pay | Admitting: Physician Assistant

## 2023-01-23 DIAGNOSIS — J4489 Other specified chronic obstructive pulmonary disease: Secondary | ICD-10-CM

## 2023-02-07 ENCOUNTER — Telehealth: Payer: Self-pay | Admitting: Physician Assistant

## 2023-02-07 NOTE — Telephone Encounter (Signed)
Left vm and sent mychart message to confirm 02/14/23 appointment-Toni

## 2023-02-14 ENCOUNTER — Encounter: Payer: Self-pay | Admitting: Physician Assistant

## 2023-02-14 ENCOUNTER — Ambulatory Visit (INDEPENDENT_AMBULATORY_CARE_PROVIDER_SITE_OTHER): Payer: Medicare Other | Admitting: Physician Assistant

## 2023-02-14 VITALS — BP 130/80 | HR 84 | Temp 98.1°F | Resp 16 | Ht 72.0 in | Wt 190.4 lb

## 2023-02-14 DIAGNOSIS — E782 Mixed hyperlipidemia: Secondary | ICD-10-CM | POA: Diagnosis not present

## 2023-02-14 DIAGNOSIS — J439 Emphysema, unspecified: Secondary | ICD-10-CM | POA: Diagnosis not present

## 2023-02-14 DIAGNOSIS — Z0000 Encounter for general adult medical examination without abnormal findings: Secondary | ICD-10-CM

## 2023-02-14 MED ORDER — MONTELUKAST SODIUM 10 MG PO TABS
10.0000 mg | ORAL_TABLET | Freq: Every day | ORAL | 3 refills | Status: DC
Start: 2023-02-14 — End: 2023-05-20

## 2023-02-14 NOTE — Progress Notes (Deleted)
[facility] [address] [place], [place] [address]  Pulmonary Sleep Medicine   Office Visit Note  Patient Name: Michael Christian DOB: 12-18-1960 MRN [id number]  Date of Service: 02/14/2023  Complaints/HPI: ***  ROS  General: (-) fever, (-) chills, (-) night sweats, (-) weakness Skin: (-) rashes, (-) itching,. Eyes: (-) visual changes, (-) redness, (-) itching. Nose and Sinuses: (-) nasal stuffiness or itchiness, (-) postnasal drip, (-) nosebleeds, (-) sinus trouble. Mouth and Throat: (-) sore throat, (-) hoarseness. Neck: (-) swollen glands, (-) enlarged thyroid, (-) neck pain. Respiratory: *** cough, (-) bloody sputum, *** shortness of breath, *** wheezing. Cardiovascular: *** ankle swelling, (-) chest pain. Lymphatic: (-) lymph node enlargement. Neurologic: (-) numbness, (-) tingling. Psychiatric: (-) anxiety, (-) depression   Current Medication: Outpatient Encounter Medications as of 02/14/2023  Medication Sig   albuterol (PROVENTIL) (2.5 MG/3ML) 0.083% nebulizer solution USE 1 VIAL VIA NEBULIZER EVERY 6 HOURS AS NEEDED FOR WHEEZING OR SHORTNESS OF BREATH   albuterol (VENTOLIN HFA) 108 (90 Base) MCG/ACT inhaler TAKE 2 PUFFS BY MOUTH EVERY 6 HOURS AS NEEDED FOR WHEEZE OR SHORTNESS OF BREATH   atorvastatin (LIPITOR) 20 MG tablet Take 1 tablet (20 mg total) by mouth daily.   clotrimazole-betamethasone (LOTRISONE) cream Apply 1 application  topically daily.   Fluticasone-Umeclidin-Vilant (TRELEGY ELLIPTA) 100-62.5-25 MCG/ACT AEPB 1 puff into the lungs daily.   omeprazole (PRILOSEC) 20 MG capsule TAKE 1 CAPSULE BY MOUTH EVERY DAY   No facility-administered encounter medications on file as of 02/14/2023.    Surgical History: Past Surgical History:  Procedure Laterality Date   BACK SURGERY     TONSILLECTOMY      Medical History: Past Medical History:  Diagnosis Date   Barrett's esophagus without dysplasia    Cervical disc disorder with radiculopathy,  unspecified cervical region    Chronic obstructive pulmonary disease (COPD) (HCC)    COPD (chronic obstructive pulmonary disease) (HCC)    Mixed hyperlipidemia     Family History: Family History  Problem Relation Age of Onset   Alzheimer's disease Father    Lung cancer Brother    Emphysema Brother     Social History: Social History   Socioeconomic History   Marital status: Married    Spouse name: Not on file   Number of children: Not on file   Years of education: Not on file   Highest education level: Not on file  Occupational History   Not on file  Tobacco Use   Smoking status: Former   Smokeless tobacco: Never  Vaping Use   Vaping status: Never Used  Substance and Sexual Activity   Alcohol use: No   Drug use: No   Sexual activity: Not on file  Other Topics Concern   Not on file  Social History Narrative   Not on file   Social Drivers of Health   Financial Resource Strain: Not on file  Food Insecurity: Not on file  Transportation Needs: Not on file  Physical Activity: Not on file  Stress: Not on file  Social Connections: Not on file  Intimate Partner Violence: Not on file    Vital Signs: Blood pressure 130/80, pulse 84, temperature 98.1 F (36.7 C), resp. rate 16, height 6' (1.829 m), weight 190 lb 6.4 oz (86.4 kg), SpO2 94%.  Examination: General Appearance: The patient is well-developed, well-nourished, and in no distress. Skin: Gross inspection of skin unremarkable. Head: normocephalic, no gross deformities. Eyes: no gross deformities noted. ENT: ears appear grossly normal no  exudates. Neck: Supple. No thyromegaly. No LAD. Respiratory: ***. Cardiovascular: Normal S1 and S2 without murmur or rub. Extremities: No cyanosis. pulses are equal. Neurologic: Alert and oriented. No involuntary movements.  LABS: No results found for this or any previous visit (from the past 2160 hours).  Radiology: CT Chest High Resolution Result Date:  08/19/2022 CLINICAL DATA:  Lung nodules. EXAM: CT CHEST WITHOUT CONTRAST TECHNIQUE: Multidetector CT imaging of the chest was performed following the standard protocol without intravenous contrast. High resolution imaging of the lungs, as well as inspiratory and expiratory imaging, was performed. RADIATION DOSE REDUCTION: This exam was performed according to the departmental dose-optimization program which includes automated exposure control, adjustment of the mA and/or kV according to patient size and/or use of iterative reconstruction technique. COMPARISON:  04/01/2022, 02/26/2022. FINDINGS: Cardiovascular: Atherosclerotic calcification of the aorta. Age advanced three-vessel coronary artery calcification. Heart size normal. No pericardial effusion. Enlarged pulmonic trunk. Mediastinum/Nodes: No pathologically enlarged mediastinal or axillary lymph nodes. Hilar regions are difficult to definitively evaluate without IV contrast. Esophagus is grossly unremarkable. Lungs/Pleura: Bullous paraseptal and centrilobular emphysema. Scattered peribronchovascular nodularity and mucoid impaction, unchanged from 02/26/2022. There are a few scattered new peribronchovascular nodules with an index medial left lower lobe measuring 6 mm (8/117). No pleural fluid. Airway is unremarkable. Upper Abdomen: Visualized portions of the liver, adrenal glands, kidneys, spleen, pancreas, stomach and bowel are grossly unremarkable. No upper abdominal adenopathy. Musculoskeletal: Degenerative changes in the spine. IMPRESSION: 1. Scattered peribronchovascular nodularity and mucoid impaction with a few new areas of involvement in the left lower lobe. 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 Additional follow-up standard CT chest without contrast could be obtained in 3-6 months in further evaluation, as clinically indicated, as malignancy cannot be excluded. 2. Age advanced three-vessel  coronary artery calcification. 3.  Aortic atherosclerosis (ICD10-I70.0). 4. Enlarged pulmonic trunk, indicative of pulmonary arterial hypertension. 5.  Emphysema (ICD10-J43.9). Electronically Signed   By: Leanna Battles M.D.   On: 08/19/2022 11:02    No results found.  No results found.    Assessment and Plan: Patient Active Problem List   Diagnosis Date Noted   Encounter for general adult medical examination with abnormal findings 11/16/2018   Dysuria 11/16/2018   Obstructive chronic bronchitis without exacerbation (HCC) 05/11/2018   Chronic obstructive pulmonary disease with acute exacerbation (HCC) 05/11/2018   Cervical disc disorder with radiculopathy, unspecified cervical region 01/31/2017   Emphysema, unspecified (HCC) 01/31/2017   COPD (chronic obstructive pulmonary disease) with emphysema (HCC) 01/31/2017   Mixed hyperlipidemia 01/31/2017   Barrett's esophagus without dysplasia 01/31/2017    ***  General Counseling: I have discussed the findings of the evaluation and examination with Michael Christian.  I have also discussed any further diagnostic evaluation thatmay be needed or ordered today. Michael Christian verbalizes understanding of the findings of todays visit. We also reviewed his medications today and discussed drug interactions and side effects including but not limited excessive drowsiness and altered mental states. We also discussed that there is always a risk not just to him but also people around him. he has been encouraged to call the office with any questions or concerns that should arise related to todays visit.  No orders of the defined types were placed in this encounter.    Time spent: ***  I have personally obtained a history, examined the patient, evaluated laboratory and imaging results, formulated the assessment and plan and placed orders. This patient was seen by Lynn Ito, PA-C in  collaboration with Dr. Freda Munro as a part of collaborative care  agreement.     Yevonne Pax, MD [facility] Pulmonary and Critical Care Sleep medicine

## 2023-02-14 NOTE — Progress Notes (Signed)
[facility] [address] [place], [place] [address]  Internal MEDICINE  Office Visit Note  Patient Name: Michael Christian  [id number]  [id number]  Date of Service: 02/14/2023  Chief Complaint  Patient presents with   Medicare Wellness   Hyperlipidemia    HPI Michael Christian presents for an annual well visit and physical exam.  Well-appearing 63 y.o. male Routine CRC screening: Due in Dec Eye exam: next month Labs: lab slip given Other concerns: Breathing doing ok, a little congestion, but feeling well overall. Doing well with trelegy and follows with pulmonology -Does get some phlegm he coughs up at night when laying down which can interrupt sleep. May use mucinex at times for congestion. Discussed possibly adding Singulair to help and would like to try this. Does take omeprazole. Denies any reflux concerns, but discussed this can also cause cough at night, but states it is mucus/phlegm he is coughing up.     02/14/2023    9:59 AM  MMSE - Mini Mental State Exam  Orientation to time 5  Orientation to Place 5  Registration 3  Attention/ Calculation 5  Recall 3  Language- name 2 objects 2  Language- repeat 1  Language- follow 3 step command 3  Language- read & follow direction 1  Write a sentence 1  Copy design 1  Total score 30    Functional Status Survey: Is the patient deaf or have difficulty hearing?: No Does the patient have difficulty seeing, even when wearing glasses/contacts?: No Does the patient have difficulty concentrating, remembering, or making decisions?: No Does the patient have difficulty walking or climbing stairs?: No Does the patient have difficulty dressing or bathing?: No Does the patient have difficulty doing errands alone such as visiting a doctor's office or shopping?: No     12/25/2020    8:50 AM 06/25/2021    8:21 AM 02/08/2022    9:48 AM 08/09/2022    9:39 AM 02/14/2023    9:58 AM  Fall Risk  Falls in the past year? 0 0 0 0 0  (RETIRED)  Patient Fall Risk Level  Low fall risk     Patient at Risk for Falls Due to  No Fall Risks     Fall risk Follow up  Falls evaluation completed          02/14/2023    9:58 AM  Depression screen PHQ 2/9  Decreased Interest 0  Down, Depressed, Hopeless 0  PHQ - 2 Score 0        No data to display            Current Medication: Outpatient Encounter Medications as of 02/14/2023  Medication Sig   albuterol (PROVENTIL) (2.5 MG/3ML) 0.083% nebulizer solution USE 1 VIAL VIA NEBULIZER EVERY 6 HOURS AS NEEDED FOR WHEEZING OR SHORTNESS OF BREATH   albuterol (VENTOLIN HFA) 108 (90 Base) MCG/ACT inhaler TAKE 2 PUFFS BY MOUTH EVERY 6 HOURS AS NEEDED FOR WHEEZE OR SHORTNESS OF BREATH   atorvastatin (LIPITOR) 20 MG tablet Take 1 tablet (20 mg total) by mouth daily.   clotrimazole-betamethasone (LOTRISONE) cream Apply 1 application  topically daily.   Fluticasone-Umeclidin-Vilant (TRELEGY ELLIPTA) 100-62.5-25 MCG/ACT AEPB 1 puff into the lungs daily.   montelukast (SINGULAIR) 10 MG tablet Take 1 tablet (10 mg total) by mouth at bedtime.   omeprazole (PRILOSEC) 20 MG capsule TAKE 1 CAPSULE BY MOUTH EVERY DAY   No facility-administered encounter medications on file as of 02/14/2023.    Surgical History: Past Surgical  History:  Procedure Laterality Date   BACK SURGERY     TONSILLECTOMY      Medical History: Past Medical History:  Diagnosis Date   Barrett's esophagus without dysplasia    Cervical disc disorder with radiculopathy, unspecified cervical region    Chronic obstructive pulmonary disease (COPD) (HCC)    COPD (chronic obstructive pulmonary disease) (HCC)    Mixed hyperlipidemia     Family History: Family History  Problem Relation Age of Onset   Alzheimer's disease Father    Lung cancer Brother    Emphysema Brother     Social History   Socioeconomic History   Marital status: Married    Spouse name: Not on file   Number of children: Not on file   Years of education:  Not on file   Highest education level: Not on file  Occupational History   Not on file  Tobacco Use   Smoking status: Former   Smokeless tobacco: Never  Vaping Use   Vaping status: Never Used  Substance and Sexual Activity   Alcohol use: No   Drug use: No   Sexual activity: Not on file  Other Topics Concern   Not on file  Social History Narrative   Not on file   Social Drivers of Health   Financial Resource Strain: Not on file  Food Insecurity: Not on file  Transportation Needs: Not on file  Physical Activity: Not on file  Stress: Not on file  Social Connections: Not on file  Intimate Partner Violence: Not on file      Review of Systems  Constitutional:  Negative for chills, fatigue and unexpected weight change.  HENT:  Positive for postnasal drip. Negative for rhinorrhea, sneezing and sore throat.   Eyes:  Negative for redness.  Respiratory:  Positive for cough. Negative for chest tightness and wheezing.   Cardiovascular:  Negative for chest pain and palpitations.  Gastrointestinal:  Negative for abdominal pain, constipation, diarrhea, nausea and vomiting.  Genitourinary:  Negative for dysuria and frequency.  Musculoskeletal:  Negative for arthralgias, back pain, joint swelling and neck pain.  Skin:  Negative for rash.  Neurological: Negative.  Negative for tremors and numbness.  Hematological:  Negative for adenopathy. Does not bruise/bleed easily.  Psychiatric/Behavioral:  Negative for behavioral problems (Depression), sleep disturbance and suicidal ideas. The patient is not nervous/anxious.     Vital Signs: BP 130/80   Pulse 84   Temp 98.1 F (36.7 C)   Resp 16   Ht 6' (1.829 m)   Wt 190 lb 6.4 oz (86.4 kg)   SpO2 94%   BMI 25.82 kg/m    Physical Exam Vitals and nursing note reviewed.  Constitutional:      Appearance: Normal appearance.  HENT:     Head: Normocephalic and atraumatic.  Eyes:     Extraocular Movements: Extraocular movements intact.   Cardiovascular:     Rate and Rhythm: Normal rate and regular rhythm.  Pulmonary:     Effort: Pulmonary effort is normal.     Breath sounds: Normal breath sounds.  Neurological:     Mental Status: He is alert and oriented to person, place, and time.  Psychiatric:        Mood and Affect: Mood normal.        Behavior: Behavior normal.        Assessment/Plan: 1. Encounter for Medicare annual wellness exam (Primary) AWV performed, lab slip given, declines vaccines  2. Pulmonary emphysema, unspecified emphysema type (HCC) Continue  inhalers as prescribed, will add singulair due to coughing up mucus at night and some chronic congestion/allergies to see if this helps - montelukast (SINGULAIR) 10 MG tablet; Take 1 tablet (10 mg total) by mouth at bedtime.  Dispense: 30 tablet; Refill: 3  3. Mixed hyperlipidemia Continue lipitor and will update labs    General Counseling: Savien verbalizes understanding of the findings of todays visit and agrees with plan of treatment. I have discussed any further diagnostic evaluation that may be needed or ordered today. We also reviewed his medications today. he has been encouraged to call the office with any questions or concerns that should arise related to todays visit.    No orders of the defined types were placed in this encounter.   Meds ordered this encounter  Medications   montelukast (SINGULAIR) 10 MG tablet    Sig: Take 1 tablet (10 mg total) by mouth at bedtime.    Dispense:  30 tablet    Refill:  3    Return in about 6 months (around 08/14/2023) for general follow up.   Total time spent:35 Minutes Time spent includes review of chart, medications, test results, and follow up plan with the patient.   [place] Controlled Substance Database was reviewed by me.  This patient was seen by Lynn Ito, PA-C in collaboration with Dr. Beverely Risen as a part of collaborative care agreement.  Lynn Ito, PA-C Internal medicine

## 2023-02-19 ENCOUNTER — Encounter: Payer: Medicare Other | Admitting: Nurse Practitioner

## 2023-02-19 ENCOUNTER — Encounter: Payer: Self-pay | Admitting: Nurse Practitioner

## 2023-02-21 ENCOUNTER — Other Ambulatory Visit: Payer: Self-pay | Admitting: Physician Assistant

## 2023-02-21 DIAGNOSIS — R6889 Other general symptoms and signs: Secondary | ICD-10-CM | POA: Diagnosis not present

## 2023-02-21 DIAGNOSIS — E782 Mixed hyperlipidemia: Secondary | ICD-10-CM | POA: Diagnosis not present

## 2023-02-21 DIAGNOSIS — Z125 Encounter for screening for malignant neoplasm of prostate: Secondary | ICD-10-CM | POA: Diagnosis not present

## 2023-02-21 DIAGNOSIS — E559 Vitamin D deficiency, unspecified: Secondary | ICD-10-CM | POA: Diagnosis not present

## 2023-02-21 DIAGNOSIS — E039 Hypothyroidism, unspecified: Secondary | ICD-10-CM | POA: Diagnosis not present

## 2023-02-21 DIAGNOSIS — Z13228 Encounter for screening for other metabolic disorders: Secondary | ICD-10-CM | POA: Diagnosis not present

## 2023-02-22 LAB — COMPREHENSIVE METABOLIC PANEL
ALT: 18 [IU]/L (ref 0–44)
AST: 19 [IU]/L (ref 0–40)
Albumin: 4.2 g/dL (ref 3.9–4.9)
Alkaline Phosphatase: 110 [IU]/L (ref 44–121)
BUN/Creatinine Ratio: 19 (ref 10–24)
BUN: 15 mg/dL (ref 8–27)
Bilirubin Total: 0.3 mg/dL (ref 0.0–1.2)
CO2: 26 mmol/L (ref 20–29)
Calcium: 9.2 mg/dL (ref 8.6–10.2)
Chloride: 100 mmol/L (ref 96–106)
Creatinine, Ser: 0.79 mg/dL (ref 0.76–1.27)
Globulin, Total: 2.4 g/dL (ref 1.5–4.5)
Glucose: 116 mg/dL — ABNORMAL HIGH (ref 70–99)
Potassium: 5.2 mmol/L (ref 3.5–5.2)
Sodium: 139 mmol/L (ref 134–144)
Total Protein: 6.6 g/dL (ref 6.0–8.5)
eGFR: 101 mL/min/{1.73_m2} (ref >59)

## 2023-02-22 LAB — CBC WITH DIFFERENTIAL/PLATELET
Basophils Absolute: 0 10*3/uL (ref 0.0–0.2)
Basos: 1 %
EOS (ABSOLUTE): 0.1 10*3/uL (ref 0.0–0.4)
Eos: 2 %
Hematocrit: 50.6 % (ref 37.5–51.0)
Hemoglobin: 16.8 g/dL (ref 13.0–17.7)
Immature Grans (Abs): 0 10*3/uL (ref 0.0–0.1)
Immature Granulocytes: 0 %
Lymphocytes Absolute: 2.4 10*3/uL (ref 0.7–3.1)
Lymphs: 29 %
MCH: 31.7 pg (ref 26.6–33.0)
MCHC: 33.2 g/dL (ref 31.5–35.7)
MCV: 96 fL (ref 79–97)
Monocytes Absolute: 0.7 10*3/uL (ref 0.1–0.9)
Monocytes: 8 %
Neutrophils Absolute: 5.3 10*3/uL (ref 1.4–7.0)
Neutrophils: 60 %
Platelets: 328 10*3/uL (ref 150–450)
RBC: 5.3 x10E6/uL (ref 4.14–5.80)
RDW: 13.1 % (ref 11.6–15.4)
WBC: 8.5 10*3/uL (ref 3.4–10.8)

## 2023-02-22 LAB — LIPID PANEL WITH LDL/HDL RATIO
Cholesterol, Total: 118 mg/dL (ref 100–199)
HDL: 36 mg/dL — ABNORMAL LOW (ref >39)
LDL Chol Calc (NIH): 59 mg/dL (ref 0–99)
LDL/HDL Ratio: 1.6 {ratio} (ref 0.0–3.6)
Triglycerides: 127 mg/dL (ref 0–149)
VLDL Cholesterol Cal: 23 mg/dL (ref 5–40)

## 2023-02-22 LAB — PSA, TOTAL AND FREE
PSA, Free Pct: 50 %
PSA, Free: 0.3 ng/mL
Prostate Specific Ag, Serum: 0.6 ng/mL (ref 0.0–4.0)

## 2023-02-22 LAB — TSH: TSH: 1.21 u[IU]/mL (ref 0.450–4.500)

## 2023-02-22 LAB — VITAMIN D 25 HYDROXY (VIT D DEFICIENCY, FRACTURES): Vit D, 25-Hydroxy: 30.4 ng/mL (ref 30.0–100.0)

## 2023-02-22 LAB — T4, FREE: Free T4: 1.43 ng/dL (ref 0.82–1.77)

## 2023-02-23 NOTE — Progress Notes (Signed)
 Appt cancelled and rescheduled due to medical emergency at clinic

## 2023-02-25 ENCOUNTER — Telehealth: Payer: Self-pay

## 2023-02-25 ENCOUNTER — Ambulatory Visit: Payer: Medicare Other | Admitting: Nurse Practitioner

## 2023-02-25 NOTE — Telephone Encounter (Signed)
Lvm regarding normal labs and vitamin D OTC supplementation.

## 2023-02-25 NOTE — Telephone Encounter (Signed)
-----   Message from Carlean Jews sent at 02/25/2023  8:53 AM EST ----- Please let him know that overall labs look good. Sugar was a little elevated again. Vit D improved but still borderline low and recommend OTC supplement.

## 2023-03-04 ENCOUNTER — Telehealth: Payer: Self-pay | Admitting: Nurse Practitioner

## 2023-03-04 NOTE — Telephone Encounter (Signed)
 Lvm & sent mychart msg to change 03/06/23 appointment to virtual-Toni

## 2023-03-06 ENCOUNTER — Telehealth: Payer: Self-pay | Admitting: Nurse Practitioner

## 2023-03-06 ENCOUNTER — Encounter: Payer: Self-pay | Admitting: Nurse Practitioner

## 2023-03-06 ENCOUNTER — Ambulatory Visit: Payer: Medicare Other | Admitting: Nurse Practitioner

## 2023-03-06 ENCOUNTER — Telehealth (INDEPENDENT_AMBULATORY_CARE_PROVIDER_SITE_OTHER): Payer: Medicare Other | Admitting: Nurse Practitioner

## 2023-03-06 DIAGNOSIS — J0100 Acute maxillary sinusitis, unspecified: Secondary | ICD-10-CM

## 2023-03-06 DIAGNOSIS — R911 Solitary pulmonary nodule: Secondary | ICD-10-CM

## 2023-03-06 DIAGNOSIS — J432 Centrilobular emphysema: Secondary | ICD-10-CM | POA: Diagnosis not present

## 2023-03-06 DIAGNOSIS — J849 Interstitial pulmonary disease, unspecified: Secondary | ICD-10-CM | POA: Diagnosis not present

## 2023-03-06 DIAGNOSIS — R0602 Shortness of breath: Secondary | ICD-10-CM

## 2023-03-06 MED ORDER — AZITHROMYCIN 250 MG PO TABS: ORAL_TABLET | ORAL | 0 refills | Status: AC

## 2023-03-06 MED ORDER — PREDNISONE 10 MG (21) PO TBPK: ORAL_TABLET | ORAL | 0 refills | Status: DC

## 2023-03-06 NOTE — Telephone Encounter (Signed)
 Lvm to schedule follow up appt and pft-Toni

## 2023-03-06 NOTE — Progress Notes (Signed)
 [facility] [address] [place], [place] [address]  Internal MEDICINE  Telephone Visit  Patient Name: Michael Christian  [id number]  [id number]  Date of Service: 03/06/2023  I connected with the patient at 1045 by telephone and verified the patients identity using two identifiers.   I discussed the limitations, risks, security and privacy concerns of performing an evaluation and management service by telephone and the availability of in person appointments. I also discussed with the patient that there may be a patient responsible charge related to the service.  The patient expressed understanding and agrees to proceed.    Chief Complaint  Patient presents with   Telephone Screen    Follow up need pft in may   Telephone Assessment   COPD    HPI Michael Christian presents for a telehealth virtual visit for COPD/emphysema, possible MAC, repeat testing and imaging.  COPD/emphysema -- switched to trelegy ellipta last year which works better or him than wixela inhaler. Trelegy is helping but patient recently contracted the flu and is slowly recovering from this. Reports increased SOB, mild fever and increased sinus drainage. He was also started on montelukast at his last PCP visit and this has helped the sinus drainage until he got the flu.  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  Possible signs of pulmonary arterial hypertension seen on last CT chest as well -- enlarged pulmonic trunk  Due for annual PFT. Unable to do spirometry testing today due to inclement weather.   Current Medication: Outpatient Encounter Medications as of 03/06/2023  Medication Sig   albuterol (PROVENTIL) (2.5 MG/3ML) 0.083% nebulizer solution USE 1 VIAL VIA NEBULIZER EVERY 6 HOURS AS NEEDED FOR WHEEZING OR SHORTNESS OF BREATH   albuterol (VENTOLIN HFA) 108 (90 Base) MCG/ACT inhaler TAKE 2 PUFFS BY MOUTH EVERY 6 HOURS AS NEEDED FOR  WHEEZE OR SHORTNESS OF BREATH   atorvastatin (LIPITOR) 20 MG tablet Take 1 tablet (20 mg total) by mouth daily.   azithromycin (ZITHROMAX) 250 MG tablet Take 2 tablets on day 1, then 1 tablet daily on days 2 through 5   clotrimazole-betamethasone (LOTRISONE) cream Apply 1 application  topically daily.   Fluticasone-Umeclidin-Vilant (TRELEGY ELLIPTA) 100-62.5-25 MCG/ACT AEPB 1 puff into the lungs daily.   montelukast (SINGULAIR) 10 MG tablet Take 1 tablet (10 mg total) by mouth at bedtime.   omeprazole (PRILOSEC) 20 MG capsule TAKE 1 CAPSULE BY MOUTH EVERY DAY   predniSONE (STERAPRED UNI-PAK 21 TAB) 10 MG (21) TBPK tablet Use as directed for 6 days   No facility-administered encounter medications on file as of 03/06/2023.    Surgical History: Past Surgical History:  Procedure Laterality Date   BACK SURGERY     TONSILLECTOMY      Medical History: Past Medical History:  Diagnosis Date   Barrett's esophagus without dysplasia    Cervical disc disorder with radiculopathy, unspecified cervical region    Chronic obstructive pulmonary disease (COPD) (HCC)    COPD (chronic obstructive pulmonary disease) (HCC)    Mixed hyperlipidemia     Family History: Family History  Problem Relation Age of Onset   Alzheimer's disease Father    Lung cancer Brother    Emphysema Brother     Social History   Socioeconomic History   Marital status: Married    Spouse name: Not on file   Number of children: Not on file   Years of education: Not on file  Highest education level: Not on file  Occupational History   Not on file  Tobacco Use   Smoking status: Former   Smokeless tobacco: Never  Vaping Use   Vaping status: Never Used  Substance and Sexual Activity   Alcohol use: No   Drug use: No   Sexual activity: Not on file  Other Topics Concern   Not on file  Social History Narrative   Not on file   Social Drivers of Health   Financial Resource Strain: Not on file  Food Insecurity: Not  on file  Transportation Needs: Not on file  Physical Activity: Not on file  Stress: Not on file  Social Connections: Not on file  Intimate Partner Violence: Not on file      Review of Systems  Constitutional:  Positive for activity change, appetite change, fatigue and fever (mild and brief).  HENT:  Positive for congestion, postnasal drip and rhinorrhea.   Respiratory:  Positive for cough and shortness of breath. Negative for chest tightness and wheezing.   Cardiovascular:  Negative for chest pain and palpitations.  Musculoskeletal: Negative.        Body aches   Neurological:  Positive for weakness.    Vital Signs: There were no vitals taken for this visit.   Observation/Objective: He is alert and oriented. No acute distress noted.     Assessment/Plan: 1. Centrilobular emphysema (HCC) (Primary) PFT and CT chest ordered.  - CT Chest Wo Contrast; Future - Pulmonary function test; Future  2. ILD (interstitial lung disease) (HCC) Repeat CT chest ordered  - CT Chest Wo Contrast; Future  3. Pulmonary nodule seen on imaging study Repeat CT chest ordered  - CT Chest Wo Contrast; Future  4. Acute non-recurrent maxillary sinusitis Zpak and prednisone taper prescribed., take until gone - predniSONE (STERAPRED UNI-PAK 21 TAB) 10 MG (21) TBPK tablet; Use as directed for 6 days  Dispense: 21 tablet; Refill: 0 - azithromycin (ZITHROMAX) 250 MG tablet; Take 2 tablets on day 1, then 1 tablet daily on days 2 through 5  Dispense: 6 tablet; Refill: 0  5. SOB (shortness of breath) PFT ordered. Prednisone taper prescribed, take until gone  - Pulmonary function test; Future - predniSONE (STERAPRED UNI-PAK 21 TAB) 10 MG (21) TBPK tablet; Use as directed for 6 days  Dispense: 21 tablet; Refill: 0   General Counseling: Michael Christian verbalizes understanding of the findings of today's phone visit and agrees with plan of treatment. I have discussed any further diagnostic evaluation that may be  needed or ordered today. We also reviewed his medications today. he has been encouraged to call the office with any questions or concerns that should arise related to todays visit.  Return for F/U, PFT @ Michael Christian and CT chest with Michael Christian or Michael Christian.   Orders Placed This Encounter  Procedures   CT Chest Wo Contrast   Pulmonary function test    Meds ordered this encounter  Medications   predniSONE (STERAPRED UNI-PAK 21 TAB) 10 MG (21) TBPK tablet    Sig: Use as directed for 6 days    Dispense:  21 tablet    Refill:  0    Fill new script today   azithromycin (ZITHROMAX) 250 MG tablet    Sig: Take 2 tablets on day 1, then 1 tablet daily on days 2 through 5    Dispense:  6 tablet    Refill:  0    Fill new script today    Time spent:20 Minutes Time  spent with patient included reviewing progress notes, labs, imaging studies, and discussing plan for follow up.   Controlled Substance Database was reviewed by me for overdose risk score (ORS) if appropriate.  This patient was seen by Sallyanne Kuster, FNP-C in collaboration with Dr. Beverely Risen as a part of collaborative care agreement.  Jacquette Canales R. Tedd Sias, MSN, FNP-C Internal medicine

## 2023-03-07 ENCOUNTER — Telehealth: Payer: Self-pay | Admitting: Nurse Practitioner

## 2023-03-07 NOTE — Telephone Encounter (Signed)
Notified patient of chest CT appointment date, arrival time, location-Toni

## 2023-03-11 ENCOUNTER — Ambulatory Visit
Admission: RE | Admit: 2023-03-11 | Discharge: 2023-03-11 | Disposition: A | Discharge status: Home / Self Care | Payer: Medicare Other | Source: Ambulatory Visit | Attending: Nurse Practitioner | Admitting: Nurse Practitioner

## 2023-03-11 DIAGNOSIS — J432 Centrilobular emphysema: Secondary | ICD-10-CM | POA: Diagnosis not present

## 2023-03-11 DIAGNOSIS — J849 Interstitial pulmonary disease, unspecified: Secondary | ICD-10-CM | POA: Diagnosis not present

## 2023-03-11 DIAGNOSIS — R911 Solitary pulmonary nodule: Secondary | ICD-10-CM | POA: Insufficient documentation

## 2023-03-11 DIAGNOSIS — R062 Wheezing: Secondary | ICD-10-CM | POA: Diagnosis not present

## 2023-03-31 ENCOUNTER — Other Ambulatory Visit: Payer: Self-pay | Admitting: Physician Assistant

## 2023-03-31 DIAGNOSIS — J4489 Other specified chronic obstructive pulmonary disease: Secondary | ICD-10-CM

## 2023-04-15 ENCOUNTER — Other Ambulatory Visit: Payer: Self-pay | Admitting: Physician Assistant

## 2023-04-15 DIAGNOSIS — E782 Mixed hyperlipidemia: Secondary | ICD-10-CM

## 2023-04-26 ENCOUNTER — Other Ambulatory Visit: Payer: Self-pay | Admitting: Physician Assistant

## 2023-05-13 ENCOUNTER — Other Ambulatory Visit: Payer: Self-pay | Admitting: Physician Assistant

## 2023-05-13 DIAGNOSIS — J4489 Other specified chronic obstructive pulmonary disease: Secondary | ICD-10-CM

## 2023-05-14 ENCOUNTER — Encounter: Payer: Self-pay | Admitting: Nurse Practitioner

## 2023-05-14 ENCOUNTER — Ambulatory Visit: Payer: Medicare Other | Admitting: Internal Medicine

## 2023-05-14 DIAGNOSIS — J432 Centrilobular emphysema: Secondary | ICD-10-CM

## 2023-05-14 DIAGNOSIS — R0602 Shortness of breath: Secondary | ICD-10-CM

## 2023-05-20 ENCOUNTER — Other Ambulatory Visit: Payer: Self-pay | Admitting: Physician Assistant

## 2023-05-20 DIAGNOSIS — J439 Emphysema, unspecified: Secondary | ICD-10-CM

## 2023-05-26 ENCOUNTER — Other Ambulatory Visit: Payer: Self-pay

## 2023-05-26 DIAGNOSIS — J4489 Other specified chronic obstructive pulmonary disease: Secondary | ICD-10-CM

## 2023-05-26 MED ORDER — ALBUTEROL SULFATE (2.5 MG/3ML) 0.083% IN NEBU: INHALATION_SOLUTION | RESPIRATORY_TRACT | 3 refills | Status: DC

## 2023-05-29 ENCOUNTER — Encounter: Payer: Self-pay | Admitting: Nurse Practitioner

## 2023-05-29 ENCOUNTER — Ambulatory Visit: Payer: Medicare Other | Admitting: Nurse Practitioner

## 2023-05-29 VITALS — BP 126/82 | HR 74 | Temp 98.3°F | Resp 16 | Ht 72.0 in | Wt 183.8 lb

## 2023-05-29 DIAGNOSIS — R0609 Other forms of dyspnea: Secondary | ICD-10-CM | POA: Diagnosis not present

## 2023-05-29 DIAGNOSIS — J432 Centrilobular emphysema: Secondary | ICD-10-CM

## 2023-05-29 DIAGNOSIS — I288 Other diseases of pulmonary vessels: Secondary | ICD-10-CM

## 2023-05-29 DIAGNOSIS — I2510 Atherosclerotic heart disease of native coronary artery without angina pectoris: Secondary | ICD-10-CM

## 2023-05-29 NOTE — Progress Notes (Signed)
 [facility] [address] [place], [place] [address]  Internal MEDICINE  Office Visit Note  Patient Name: Michael Christian  [id number]  [id number]  Date of Service: 05/29/2023  Chief Complaint  Patient presents with   Follow-up    Review pft and chest CT    HPI Michael Christian presents for a follow-up visit for review of CT chest and PFT results  CT chest results:  1. Chronic scattered mucoid impaction and nodularity in the lungs, unchanged dating back to 02/26/2022. Additional follow-up could be obtained in 1 year to ensure continued stability, in this patient at increased risk for bronchogenic carcinoma. 2. Age advanced three-vessel coronary artery calcification. 3. Markedly enlarged pulmonic trunk, indicative of pulmonary arterial hypertension. 4.  Emphysema (ICD10-J43.9).  PFT results: The forced vital capacity is mildly decreased. The FEV1 is 1.61 L which is 43% of predicted and is severely decreased. FEV1 FVC ratio was severely decreased. Total lung capacity is normal. Residual volume was increased. Residual volume for lung preservation was increased. FRC is increased. DLCO was severely decreased. PFT results are consistent with severe obstructive lung disease.    Current Medication: Outpatient Encounter Medications as of 05/29/2023  Medication Sig   albuterol  (PROVENTIL ) (2.5 MG/3ML) 0.083% nebulizer solution USE 1 VIAL VIA NEBULIZER EVERY 6 HOURS AS NEEDED FOR WHEEZING OR SHORTNESS OF BREATH   albuterol  (VENTOLIN  HFA) 108 (90 Base) MCG/ACT inhaler TAKE 2 PUFFS BY MOUTH EVERY 6 HOURS AS NEEDED FOR WHEEZE OR SHORTNESS OF BREATH   atorvastatin  (LIPITOR) 20 MG tablet TAKE 1 TABLET BY MOUTH EVERY DAY   clotrimazole -betamethasone  (LOTRISONE ) cream Apply 1 application  topically daily.   Fluticasone -Umeclidin-Vilant (TRELEGY ELLIPTA ) 100-62.5-25 MCG/ACT AEPB INHALE 1 PUFF INTO THE LUNGS DAILY   montelukast  (SINGULAIR ) 10 MG tablet TAKE 1 TABLET(10 MG) BY MOUTH AT BEDTIME    omeprazole  (PRILOSEC) 20 MG capsule TAKE 1 CAPSULE BY MOUTH EVERY DAY   predniSONE  (STERAPRED UNI-PAK 21 TAB) 10 MG (21) TBPK tablet Use as directed for 6 days   No facility-administered encounter medications on file as of 05/29/2023.    Surgical History: Past Surgical History:  Procedure Laterality Date   BACK SURGERY     TONSILLECTOMY      Medical History: Past Medical History:  Diagnosis Date   Barrett's esophagus without dysplasia    Cervical disc disorder with radiculopathy, unspecified cervical region    Chronic obstructive pulmonary disease (COPD) (HCC)    COPD (chronic obstructive pulmonary disease) (HCC)    Mixed hyperlipidemia     Family History: Family History  Problem Relation Age of Onset   Alzheimer's disease Father    Lung cancer Brother    Emphysema Brother     Social History   Socioeconomic History   Marital status: Married    Spouse name: Not on file   Number of children: Not on file   Years of education: Not on file   Highest education level: Not on file  Occupational History   Not on file  Tobacco Use   Smoking status: Former   Smokeless tobacco: Never  Vaping Use   Vaping status: Never Used  Substance and Sexual Activity   Alcohol use: No   Drug use: No   Sexual activity: Not on file  Other Topics Concern   Not on file  Social History Narrative   Not on file   Social Drivers of Health   Financial Resource Strain: Not on file  Food Insecurity: Not on file  Transportation Needs: Not  on file  Physical Activity: Not on file  Stress: Not on file  Social Connections: Not on file  Intimate Partner Violence: Not on file      Review of Systems  Constitutional:  Positive for fatigue. Negative for appetite change, chills and fever.  HENT:  Negative for congestion, postnasal drip and rhinorrhea.   Respiratory:  Positive for cough and shortness of breath. Negative for chest tightness and wheezing.        Symptoms are intermittent   Cardiovascular: Negative.  Negative for chest pain and palpitations.  Musculoskeletal: Negative.     Vital Signs: BP 126/82   Pulse 74   Temp 98.3 F (36.8 C)   Resp 16   Ht 6' (1.829 m)   Wt 183 lb 12.8 oz (83.4 kg)   SpO2 97%   BMI 24.93 kg/m    Physical Exam Vitals reviewed.  Constitutional:      General: He is not in acute distress.    Appearance: Normal appearance. He is obese. He is not ill-appearing.  HENT:     Head: Normocephalic and atraumatic.   Eyes:     Pupils: Pupils are equal, round, and reactive to light.    Cardiovascular:     Rate and Rhythm: Normal rate and regular rhythm.     Heart sounds: Normal heart sounds. No murmur heard. Pulmonary:     Effort: Pulmonary effort is normal. No respiratory distress.     Breath sounds: Normal breath sounds. No wheezing.   Skin:    General: Skin is warm and dry.     Capillary Refill: Capillary refill takes less than 2 seconds.   Neurological:     Mental Status: He is alert and oriented to person, place, and time.   Psychiatric:        Mood and Affect: Mood normal.        Behavior: Behavior normal.        Assessment/Plan: 1. Centrilobular emphysema (HCC) (Primary) Continue trelegy ellipta  as prescribed. Continue prn albuterol  inhaler and prn neb treatments as prescribed.   2. 3-vessel CAD Referred to cardiology  - Ambulatory referral to Cardiology  3. Dyspnea on exertion Echocardiogram ordered and referred to cardiology - ECHOCARDIOGRAM COMPLETE; Future - Ambulatory referral to Cardiology  4. Dilation of pulmonary artery (HCC) Echocardiogram ordered and referred to cardiology - ECHOCARDIOGRAM COMPLETE; Future - Ambulatory referral to Cardiology   General Counseling: Bronco verbalizes understanding of the findings of todays visit and agrees with plan of treatment. I have discussed any further diagnostic evaluation that may be needed or ordered today. We also reviewed his medications today. he  has been encouraged to call the office with any questions or concerns that should arise related to todays visit.    Orders Placed This Encounter  Procedures   Ambulatory referral to Cardiology   ECHOCARDIOGRAM COMPLETE    No orders of the defined types were placed in this encounter.   Return in about 4 weeks (around 06/26/2023) for F/U, pulmonary only with DSK to review echocardiogram .   Total time spent:30 Minutes Time spent includes review of chart, medications, test results, and follow up plan with the patient.   [place] Controlled Substance Database was reviewed by me.  This patient was seen by Laurence Pons, FNP-C in collaboration with Dr. Verneta Gone as a part of collaborative care agreement.   Cynthea Zachman R. Bobbi Burow, MSN, FNP-C Internal medicine

## 2023-06-03 ENCOUNTER — Telehealth: Payer: Self-pay | Admitting: Nurse Practitioner

## 2023-06-03 NOTE — Telephone Encounter (Signed)
 Lvm to return my call regarding echo appt and to schedule f/u with dsk-Toni

## 2023-06-18 ENCOUNTER — Other Ambulatory Visit: Payer: Self-pay | Admitting: Physician Assistant

## 2023-06-18 DIAGNOSIS — K2270 Barrett's esophagus without dysplasia: Secondary | ICD-10-CM

## 2023-06-18 NOTE — Procedures (Signed)
 [facility] [address] [place], [address]    Complete Pulmonary Function Testing Interpretation:  FINDINGS:  The forced vital capacity is mildly decreased.  The FEV1 is 1.61 L which is 43% of predicted and is severely decreased.  FEV1 FVC ratio was severely decreased.  Total lung capacity is normal.  Residual volume was increased.  Residual volume for lung preservation was increased.  FRC is increased.  DLCO was severely decreased.  IMPRESSION:  This pulmonary function study is consistent with severe obstructive lung disease  Cordie Deters, MD [facility] Pulmonary Critical Care Medicine Sleep Medicine

## 2023-06-26 DIAGNOSIS — R0609 Other forms of dyspnea: Secondary | ICD-10-CM | POA: Insufficient documentation

## 2023-06-26 DIAGNOSIS — I288 Other diseases of pulmonary vessels: Secondary | ICD-10-CM | POA: Insufficient documentation

## 2023-06-26 DIAGNOSIS — I2510 Atherosclerotic heart disease of native coronary artery without angina pectoris: Secondary | ICD-10-CM | POA: Insufficient documentation

## 2023-07-24 ENCOUNTER — Ambulatory Visit
Admission: RE | Admit: 2023-07-24 | Discharge: 2023-07-24 | Disposition: A | Discharge status: Home / Self Care | Source: Ambulatory Visit | Attending: Nurse Practitioner | Admitting: Nurse Practitioner

## 2023-07-24 DIAGNOSIS — I288 Other diseases of pulmonary vessels: Secondary | ICD-10-CM | POA: Insufficient documentation

## 2023-07-24 DIAGNOSIS — J449 Chronic obstructive pulmonary disease, unspecified: Secondary | ICD-10-CM | POA: Insufficient documentation

## 2023-07-24 DIAGNOSIS — I3481 Nonrheumatic mitral (valve) annulus calcification: Secondary | ICD-10-CM | POA: Diagnosis not present

## 2023-07-24 DIAGNOSIS — I272 Pulmonary hypertension, unspecified: Secondary | ICD-10-CM | POA: Diagnosis not present

## 2023-07-24 DIAGNOSIS — I517 Cardiomegaly: Secondary | ICD-10-CM | POA: Insufficient documentation

## 2023-07-24 DIAGNOSIS — R0609 Other forms of dyspnea: Secondary | ICD-10-CM | POA: Insufficient documentation

## 2023-07-24 LAB — ECHOCARDIOGRAM COMPLETE: S' Lateral: 2.7 cm

## 2023-07-28 ENCOUNTER — Other Ambulatory Visit: Payer: Self-pay | Admitting: Physician Assistant

## 2023-07-30 ENCOUNTER — Telehealth: Payer: Self-pay | Admitting: *Deleted

## 2023-07-30 NOTE — Telephone Encounter (Signed)
 LMOVM to verify card hx.

## 2023-08-05 LAB — PULMONARY FUNCTION TEST

## 2023-08-06 ENCOUNTER — Encounter: Payer: Self-pay | Admitting: Internal Medicine

## 2023-08-06 ENCOUNTER — Ambulatory Visit: Attending: Internal Medicine | Admitting: Internal Medicine

## 2023-08-06 VITALS — BP 126/76 | HR 72 | Ht 72.0 in | Wt 185.6 lb

## 2023-08-06 DIAGNOSIS — R0609 Other forms of dyspnea: Secondary | ICD-10-CM

## 2023-08-06 DIAGNOSIS — I2510 Atherosclerotic heart disease of native coronary artery without angina pectoris: Secondary | ICD-10-CM

## 2023-08-06 DIAGNOSIS — J449 Chronic obstructive pulmonary disease, unspecified: Secondary | ICD-10-CM

## 2023-08-06 DIAGNOSIS — E785 Hyperlipidemia, unspecified: Secondary | ICD-10-CM | POA: Diagnosis not present

## 2023-08-06 MED ORDER — ASPIRIN 81 MG PO TBEC: 81.0000 mg | DELAYED_RELEASE_TABLET | Freq: Every day | ORAL | Status: AC

## 2023-08-06 NOTE — Progress Notes (Signed)
 Cardiology Office Note:  .   Date:  08/06/2023  ID:  Michael Christian, DOB April 09, 1960, MRN [id number] PCP: Michael Christian Michael Christian  [facility] Providers Cardiologist:  None     History of Present Illness: .   Michael Christian is a 63 y.o. male with history of hyperlipidemia, COPD, Barrett's esophagus, and cervical degenerative disc disease, who has been referred for incidental findings of coronary artery calcification and markedly enlarged pulmonary artery trunk suggestive of pulmonary hypertension on CT of the chest from 03/2023.  Michael Christian reports that he has been feeling fairly well other than exertional dyspnea with mild activities such as taking out the garbage or going up a flight of stairs.  This has been gradually progressing for 10 years but has been fairly stable for at least 6 to 12 months.  He attributes this to his COPD that was diagnosed 10 years ago.  He denies chest pain as well as palpitations and edema.  He sometimes feels a little lightheaded when he is very active and out of breath.  He has not passed out.  He denies a history of heart disease and prior heart testing before his recent echocardiogram outlined below.  ROS: See HPI  Studies Reviewed: Michael Christian   EKG Interpretation Date/Time:  Wednesday August 06 2023 08:55:01 EDT Ventricular Rate:  72 PR Interval:  170 QRS Duration:  138 QT Interval:  424 QTC Calculation: 464 R Axis:   122  Text Interpretation: Normal sinus rhythm Right bundle branch block Left posterior fascicular block Bifascicular block Abnormal ECG When compared with ECG of 14-Dec-2014 13:57, No significant change was found Confirmed by Michael Christian ([id number]) on 08/06/2023 9:00:36 AM    TTE (07/24/2023): Normal LV size with mild LVH.  LVEF 55-60% with normal wall motion.  Indeterminate diastolic parameters.  Mildly dilated right ventricle with moderately reduced function.  Unable to assess PA pressure.  Normal biatrial size.  No pericardial effusion.   Structurally normal mitral valve with mild annular calcification.  No regurgitation or stenosis.  Risk Assessment/Calculations:             Physical Exam:   VS:  BP 126/76   Pulse 72   Ht 6' (1.829 m)   Wt 185 lb 9.6 oz (84.2 kg)   SpO2 97%   BMI 25.17 kg/m    Wt Readings from Last 3 Encounters:  08/06/23 185 lb 9.6 oz (84.2 kg)  05/29/23 183 lb 12.8 oz (83.4 kg)  02/19/23 190 lb 12.8 oz (86.5 kg)    General:  NAD.  Accompanied by his wife. Neck: No JVD or HJR. Lungs: Mildly diminished breath sounds throughout without wheezes or crackles. Heart: Regular rate and rhythm without murmurs, rubs, or gallops. Abdomen: Soft, nontender, nondistended. Extremities: No lower extremity edema.  2+ right radial pulse.  ASSESSMENT AND PLAN: .    Coronary artery calcification: Recent lung cancer screening CT of the chest was notable for multivessel coronary artery calcification.  Michael Christian denies angina though his chronic exertional dyspnea could be his anginal equivalent.  We discussed further evaluation options including pharmacologic myocardial perfusion stress testing, coronary CTA, and cardiac catheterization.  Given his calcium  burden, I worry that coronary CTA may be inaccurate.  He would not be able to exercise for a stress test though pharmacologic stress could be an option.  As I think he would benefit most from a right heart catheterization (as outlined below), concurrent left heart catheterization would likely be the best option.  I have discussed this procedure in depth with Michael Christian and he would like to think about it before moving forward.  I advised him to contact our office if he wishes to proceed.  In the meantime, I have recommended addition of aspirin  81 mg daily.  His lipids are well-controlled; he should continue atorvastatin  20 mg daily.  Dyspnea on exertion: Exertional dyspnea has been chronic, most likely related to extensive COPD.  However, CT of the chest showed  significant dilation of the pulmonary artery suggestive of underlying pulmonary hypertension.  Subsequent echo also showed moderate RV dysfunction consistent with longstanding pulmonary hypertension.  Unfortunately, PA pressure could not be estimated.  I recommend right heart catheterization (ideally with concurrent left heart catheterization) for further assessment and to help guide potential treatment strategies.  With evidence of RV dysfunction, I worry that he may ultimately develop irreversible RV failure.  Based on hemodynamics during the procedure, if he elects to proceed, shunt run should also be considered.  Hyperlipidemia: LDL well-controlled.  HDL slightly low.  Agree with continuation of atorvastatin  20 mg daily.  COPD: Ongoing management per Ms. Abernathy.    Dispo: Return to clinic in 3 months.  Signed, Michael Hanson, MD

## 2023-08-06 NOTE — Patient Instructions (Signed)
 Medication Instructions:  Your physician recommends the following medication changes.  START TAKING: Aspirin  81 mg by mouth daily    *If you need a refill on your cardiac medications before your next appointment, please call your pharmacy*  Lab Work: No labs ordered today    Testing/Procedures: No test ordered today   Follow-Up: At [facility], you and your health needs are our priority.  As part of our continuing mission to provide you with exceptional heart care, our providers are all part of one team.  This team includes your primary Cardiologist (physician) and Advanced Practice Providers or APPs (Physician Assistants and Nurse Practitioners) who all work together to provide you with the care you need, when you need it.  Your next appointment:   3 month(s)  Provider:   You may see Lonni Hanson, MD or one of the following Advanced Practice Providers on your designated Care Team:   Lonni Meager, NP Lesley Maffucci, PA-C Bernardino Bring, PA-C Cadence [place], PA-C Tylene Lunch, NP Barnie Hila, NP

## 2023-08-13 ENCOUNTER — Other Ambulatory Visit: Payer: Self-pay | Admitting: Physician Assistant

## 2023-08-13 DIAGNOSIS — J4489 Other specified chronic obstructive pulmonary disease: Secondary | ICD-10-CM

## 2023-08-18 DIAGNOSIS — H5203 Hypermetropia, bilateral: Secondary | ICD-10-CM | POA: Diagnosis not present

## 2023-08-21 ENCOUNTER — Ambulatory Visit: Payer: Medicare Other | Admitting: Physician Assistant

## 2023-09-19 ENCOUNTER — Other Ambulatory Visit: Payer: Self-pay | Admitting: Physician Assistant

## 2023-09-19 DIAGNOSIS — J439 Emphysema, unspecified: Secondary | ICD-10-CM

## 2023-10-28 ENCOUNTER — Other Ambulatory Visit: Payer: Self-pay | Admitting: Physician Assistant

## 2023-10-30 ENCOUNTER — Other Ambulatory Visit: Payer: Self-pay | Admitting: Physician Assistant

## 2023-10-30 DIAGNOSIS — E782 Mixed hyperlipidemia: Secondary | ICD-10-CM

## 2023-11-03 ENCOUNTER — Other Ambulatory Visit: Payer: Self-pay | Admitting: Nurse Practitioner

## 2023-11-03 ENCOUNTER — Encounter: Payer: Self-pay | Admitting: Nurse Practitioner

## 2023-11-03 ENCOUNTER — Ambulatory Visit: Admitting: Nurse Practitioner

## 2023-11-03 VITALS — BP 130/88 | HR 80 | Temp 96.7°F | Resp 16 | Ht 72.0 in | Wt 188.8 lb

## 2023-11-03 DIAGNOSIS — R300 Dysuria: Secondary | ICD-10-CM | POA: Diagnosis not present

## 2023-11-03 DIAGNOSIS — N390 Urinary tract infection, site not specified: Secondary | ICD-10-CM

## 2023-11-03 DIAGNOSIS — R319 Hematuria, unspecified: Secondary | ICD-10-CM

## 2023-11-03 DIAGNOSIS — N200 Calculus of kidney: Secondary | ICD-10-CM | POA: Diagnosis not present

## 2023-11-03 LAB — POCT URINALYSIS DIPSTICK
Bilirubin, UA: NEGATIVE
Glucose, UA: NEGATIVE
Ketones, UA: NEGATIVE
Leukocytes, UA: NEGATIVE
Nitrite, UA: NEGATIVE
Protein, UA: NEGATIVE
Spec Grav, UA: <=1.005 — AB (ref 1.010–1.025)
Urobilinogen, UA: 0.2 U/dL
pH, UA: 7 (ref 5.0–8.0)

## 2023-11-03 MED ORDER — SULFAMETHOXAZOLE-TRIMETHOPRIM 800-160 MG PO TABS: 1.0000 | ORAL_TABLET | Freq: Two times a day (BID) | ORAL | 0 refills | Status: AC

## 2023-11-03 MED ORDER — TAMSULOSIN HCL 0.4 MG PO CAPS: 0.4000 mg | ORAL_CAPSULE | Freq: Every day | ORAL | 0 refills | Status: AC

## 2023-11-03 NOTE — Progress Notes (Signed)
 [facility] [address] [place], [place] [address]  Internal MEDICINE  Office Visit Note  Patient Name: Michael Christian  [id number]  [id number]  Date of Service: 11/03/2023  Chief Complaint  Patient presents with   Acute Visit    Back pain x 3 weeks     HPI Brylon presents for an acute sick visit for back pain  --onset of back pain was 3 weeks ago Low back pain, left sided and radiates down left leg Urinalysis in office is positive for moderate blood, negative for leukocytes and nitrites.  Reports urgency, frequency and hesitancy.      Current Medication:  Outpatient Encounter Medications as of 11/03/2023  Medication Sig   sulfamethoxazole -trimethoprim  (BACTRIM  DS) 800-160 MG tablet Take 1 tablet by mouth 2 (two) times daily for 7 days. Take with food   tamsulosin  (FLOMAX ) 0.4 MG CAPS capsule Take 1 capsule (0.4 mg total) by mouth daily.   albuterol  (PROVENTIL ) (2.5 MG/3ML) 0.083% nebulizer solution USE 1 VIAL VIA NEBULIZER EVERY 6 HOURS AS NEEDED FOR WHEEZING OR SHORTNESS OF BREATH   albuterol  (VENTOLIN  HFA) 108 (90 Base) MCG/ACT inhaler TAKE 2 PUFFS BY MOUTH EVERY 6 HOURS AS NEEDED FOR WHEEZE OR SHORTNESS OF BREATH   aspirin  EC 81 MG tablet Take 1 tablet (81 mg total) by mouth daily. Swallow whole.   atorvastatin  (LIPITOR) 20 MG tablet TAKE 1 TABLET BY MOUTH EVERY DAY   clotrimazole -betamethasone  (LOTRISONE ) cream Apply 1 application  topically daily. (Patient not taking: Reported on 11/03/2023)   montelukast  (SINGULAIR ) 10 MG tablet TAKE 1 TABLET(10 MG) BY MOUTH AT BEDTIME   omeprazole  (PRILOSEC) 20 MG capsule TAKE 1 CAPSULE BY MOUTH EVERY DAY   predniSONE  (STERAPRED UNI-PAK 21 TAB) 10 MG (21) TBPK tablet Use as directed for 6 days (Patient not taking: Reported on 11/03/2023)   TRELEGY ELLIPTA  100-62.5-25 MCG/ACT AEPB INHALE 1 PUFF INTO THE LUNGS DAILY   No facility-administered encounter medications on file as of 11/03/2023.      Medical  History: Past Medical History:  Diagnosis Date   Barrett's esophagus without dysplasia    Cervical disc disorder with radiculopathy, unspecified cervical region    Chronic obstructive pulmonary disease (COPD) (HCC)    COPD (chronic obstructive pulmonary disease) (HCC)    Mixed hyperlipidemia      Vital Signs: BP 130/88   Pulse 80   Temp (!) 96.7 F (35.9 C)   Resp 16   Ht 6' (1.829 m)   Wt 188 lb 12.8 oz (85.6 kg)   SpO2 97%   BMI 25.61 kg/m    Review of Systems  Constitutional:  Negative for chills, fatigue and unexpected weight change.  HENT:  Negative for congestion, postnasal drip, rhinorrhea, sneezing and sore throat.   Eyes:  Negative for redness.  Respiratory:  Negative for cough, chest tightness and shortness of breath.   Cardiovascular:  Negative for chest pain and palpitations.  Gastrointestinal:  Negative for abdominal pain, constipation, diarrhea, nausea and vomiting.  Genitourinary:  Positive for difficulty urinating, dysuria, flank pain, frequency, hematuria and urgency.  Musculoskeletal:  Positive for back pain. Negative for arthralgias, joint swelling and neck pain.  Skin:  Negative for rash.  Neurological: Negative.  Negative for tremors and numbness.  Hematological:  Negative for adenopathy. Does not bruise/bleed easily.  Psychiatric/Behavioral:  Negative for behavioral problems (Depression), sleep disturbance and suicidal ideas. The patient is not nervous/anxious.     Physical Exam Vitals reviewed.  Constitutional:      General:  He is not in acute distress.    Appearance: Normal appearance. He is not ill-appearing.  HENT:     Head: Normocephalic and atraumatic.  Eyes:     Pupils: Pupils are equal, round, and reactive to light.  Cardiovascular:     Rate and Rhythm: Normal rate and regular rhythm.  Pulmonary:     Effort: Pulmonary effort is normal. No respiratory distress.  Abdominal:     Tenderness: There is left CVA tenderness. There is no right  CVA tenderness.  Skin:    Capillary Refill: Capillary refill takes less than 2 seconds.  Neurological:     Mental Status: He is alert and oriented to person, place, and time.  Psychiatric:        Mood and Affect: Mood normal.        Behavior: Behavior normal.       Assessment/Plan: 1. Urinary tract infection with hematuria, site unspecified (Primary) Urine sent for culture, bactrim  DS prescribed, take until gone  - POCT urinalysis dipstick - CULTURE, URINE COMPREHENSIVE - sulfamethoxazole -trimethoprim  (BACTRIM  DS) 800-160 MG tablet; Take 1 tablet by mouth 2 (two) times daily for 7 days. Take with food  Dispense: 14 tablet; Refill: 0  2. Nephrolithiasis Temsulosin also prescribed for possible kidney stones, take for 28 days then stop.  - tamsulosin  (FLOMAX ) 0.4 MG CAPS capsule; Take 1 capsule (0.4 mg total) by mouth daily.  Dispense: 28 capsule; Refill: 0  3. Dysuria Urine was abnormal, sent for culture     General Counseling: Odes verbalizes understanding of the findings of todays visit and agrees with plan of treatment. I have discussed any further diagnostic evaluation that may be needed or ordered today. We also reviewed his medications today. he has been encouraged to call the office with any questions or concerns that should arise related to todays visit.    Counseling:    Orders Placed This Encounter  Procedures   CULTURE, URINE COMPREHENSIVE   POCT urinalysis dipstick    Meds ordered this encounter  Medications   sulfamethoxazole -trimethoprim  (BACTRIM  DS) 800-160 MG tablet    Sig: Take 1 tablet by mouth 2 (two) times daily for 7 days. Take with food    Dispense:  14 tablet    Refill:  0    Fill new script today   tamsulosin  (FLOMAX ) 0.4 MG CAPS capsule    Sig: Take 1 capsule (0.4 mg total) by mouth daily.    Dispense:  28 capsule    Refill:  0    Fill new script today    Return if symptoms worsen or fail to improve.  [place] Controlled Substance  Database was reviewed by me for overdose risk score (ORS)  Time spent:30 Minutes Time spent with patient included reviewing progress notes, labs, imaging studies, and discussing plan for follow up.   This patient was seen by Mardy Maxin, FNP-C in collaboration with Dr. Sigrid Bathe as a part of collaborative care agreement.  Jennamarie Goings R. Maxin, MSN, FNP-C Internal Medicine

## 2023-11-06 ENCOUNTER — Ambulatory Visit: Admitting: Internal Medicine

## 2023-11-06 LAB — CULTURE, URINE COMPREHENSIVE

## 2023-11-13 ENCOUNTER — Other Ambulatory Visit: Payer: Self-pay | Admitting: Physician Assistant

## 2023-11-13 DIAGNOSIS — J4489 Other specified chronic obstructive pulmonary disease: Secondary | ICD-10-CM

## 2023-11-26 ENCOUNTER — Encounter: Payer: Self-pay | Admitting: Nurse Practitioner

## 2023-12-31 ENCOUNTER — Other Ambulatory Visit: Payer: Self-pay | Admitting: Physician Assistant

## 2023-12-31 DIAGNOSIS — J439 Emphysema, unspecified: Secondary | ICD-10-CM

## 2024-01-29 ENCOUNTER — Other Ambulatory Visit: Payer: Self-pay | Admitting: Physician Assistant

## 2024-01-29 DIAGNOSIS — J4489 Other specified chronic obstructive pulmonary disease: Secondary | ICD-10-CM

## 2024-02-19 ENCOUNTER — Encounter: Payer: Self-pay | Admitting: Physician Assistant

## 2024-02-19 ENCOUNTER — Ambulatory Visit (INDEPENDENT_AMBULATORY_CARE_PROVIDER_SITE_OTHER): Payer: Medicare Other | Admitting: Physician Assistant

## 2024-02-19 VITALS — BP 123/70 | HR 78 | Temp 98.0°F | Resp 16 | Ht 72.0 in | Wt 194.0 lb

## 2024-02-19 DIAGNOSIS — Z125 Encounter for screening for malignant neoplasm of prostate: Secondary | ICD-10-CM | POA: Diagnosis not present

## 2024-02-19 DIAGNOSIS — R7301 Impaired fasting glucose: Secondary | ICD-10-CM | POA: Diagnosis not present

## 2024-02-19 DIAGNOSIS — Z1211 Encounter for screening for malignant neoplasm of colon: Secondary | ICD-10-CM | POA: Diagnosis not present

## 2024-02-19 DIAGNOSIS — E782 Mixed hyperlipidemia: Secondary | ICD-10-CM

## 2024-02-19 DIAGNOSIS — J441 Chronic obstructive pulmonary disease with (acute) exacerbation: Secondary | ICD-10-CM | POA: Diagnosis not present

## 2024-02-19 DIAGNOSIS — Z1329 Encounter for screening for other suspected endocrine disorder: Secondary | ICD-10-CM | POA: Diagnosis not present

## 2024-02-19 DIAGNOSIS — Z1212 Encounter for screening for malignant neoplasm of rectum: Secondary | ICD-10-CM | POA: Diagnosis not present

## 2024-02-19 DIAGNOSIS — E559 Vitamin D deficiency, unspecified: Secondary | ICD-10-CM

## 2024-02-19 DIAGNOSIS — R5383 Other fatigue: Secondary | ICD-10-CM

## 2024-02-19 DIAGNOSIS — Z0001 Encounter for general adult medical examination with abnormal findings: Secondary | ICD-10-CM | POA: Diagnosis not present

## 2024-02-19 DIAGNOSIS — R300 Dysuria: Secondary | ICD-10-CM | POA: Diagnosis not present

## 2024-02-19 MED ORDER — PREDNISONE 10 MG PO TABS: ORAL_TABLET | ORAL | 0 refills | Status: AC

## 2024-02-19 MED ORDER — AZITHROMYCIN 250 MG PO TABS: ORAL_TABLET | ORAL | 0 refills | Status: AC

## 2024-02-19 MED ORDER — ALBUTEROL SULFATE (2.5 MG/3ML) 0.083% IN NEBU: INHALATION_SOLUTION | RESPIRATORY_TRACT | 3 refills | Status: AC

## 2024-02-19 NOTE — Progress Notes (Cosign Needed)
 [facility] [address] [place], [place] [address]  Internal MEDICINE  Office Visit Note  Patient Name: Michael Christian  [birth year]  [id number]  Date of Service: 02/19/2024  Chief Complaint  Patient presents with   Medicare Wellness   Hyperlipidemia   Shortness of Breath    Pt states he may be getting sick, SOB x3 days.   Quality Metric Gaps    Colonoscopy    HPI Michael Christian presents for an annual well visit Well-appearing 64 y.o. male Routine CRC screening: due for colonoscopy Labs: ordered Other concerns: Tuesday night started to have chills, SOB, cough, and congestion. Taking some OTC decongestants and using neb. Monitoring oxygen and is in lower 90s. Unaware of sick contacts -Also concerned about inhaler moving forward. Does very well with trelegy but it is pricey, he is going to see if alternative better covered with insurance such as [employer].     02/19/2024    9:28 AM 02/14/2023    9:59 AM  MMSE - Mini Mental State Exam  Orientation to time 5 5  Orientation to Place 5 5  Registration 3 3  Attention/ Calculation 5 5  Recall 3 3  Language- name 2 objects 2 2  Language- repeat 1 1  Language- follow 3 step command 3 3  Language- read & follow direction 1 1  Write a sentence 1 1  Copy design 1 1  Total score 30 30    Functional Status Survey: Is the patient deaf or have difficulty hearing?: No Does the patient have difficulty seeing, even when wearing glasses/contacts?: No Does the patient have difficulty concentrating, remembering, or making decisions?: No Does the patient have difficulty walking or climbing stairs?: No Does the patient have difficulty dressing or bathing?: No Does the patient have difficulty doing errands alone such as visiting a doctor's office or shopping?: No     06/25/2021    8:21 AM 02/08/2022    9:48 AM 08/09/2022    9:39 AM 02/14/2023    9:58 AM 02/19/2024    9:28 AM  Fall Risk  Falls in the past year? 0 0 0 0 0  (RETIRED)  Patient Fall Risk Level Low fall risk       Patient at Risk for Falls Due to No Fall Risks      Fall risk Follow up Falls evaluation completed          Data saved with a previous flowsheet row definition       02/19/2024    9:28 AM  Depression screen PHQ 2/9  Decreased Interest 0  Down, Depressed, Hopeless 0  PHQ - 2 Score 0        No data to display            Current Medication: Outpatient Encounter Medications as of 02/19/2024  Medication Sig   albuterol  (VENTOLIN  HFA) 108 (90 Base) MCG/ACT inhaler TAKE 2 PUFFS BY MOUTH EVERY 6 HOURS AS NEEDED FOR WHEEZE OR SHORTNESS OF BREATH   aspirin  EC 81 MG tablet Take 1 tablet (81 mg total) by mouth daily. Swallow whole.   atorvastatin  (LIPITOR) 20 MG tablet TAKE 1 TABLET BY MOUTH EVERY DAY   azithromycin  (ZITHROMAX ) 250 MG tablet Take one tab a day for 10 days for uri   clotrimazole -betamethasone  (LOTRISONE ) cream Apply 1 application  topically daily.   montelukast  (SINGULAIR ) 10 MG tablet TAKE 1 TABLET(10 MG) BY MOUTH AT BEDTIME   omeprazole  (PRILOSEC) 20 MG capsule TAKE 1 CAPSULE BY MOUTH  EVERY DAY   predniSONE  (DELTASONE ) 10 MG tablet Take one tab 3 x day for 3 days, then take one tab 2 x a day for 3 days and then take one tab a day for 3 days for copd   tamsulosin  (FLOMAX ) 0.4 MG CAPS capsule Take 1 capsule (0.4 mg total) by mouth daily.   TRELEGY ELLIPTA  100-62.5-25 MCG/ACT AEPB INHALE 1 PUFF INTO THE LUNGS DAILY   [DISCONTINUED] albuterol  (PROVENTIL ) (2.5 MG/3ML) 0.083% nebulizer solution USE 1 VIAL VIA NEBULIZER EVERY 6 HOURS AS NEEDED FOR WHEEZING OR SHORTNESS OF BREATH   [DISCONTINUED] predniSONE  (STERAPRED UNI-PAK 21 TAB) 10 MG (21) TBPK tablet Use as directed for 6 days   albuterol  (PROVENTIL ) (2.5 MG/3ML) 0.083% nebulizer solution USE 1 VIAL VIA NEBULIZER EVERY 6 HOURS AS NEEDED FOR WHEEZING OR SHORTNESS OF BREATH   No facility-administered encounter medications on file as of 02/19/2024.    Surgical History: Past Surgical  History:  Procedure Laterality Date   BACK SURGERY     TONSILLECTOMY      Medical History: Past Medical History:  Diagnosis Date   Barrett's esophagus without dysplasia    Cervical disc disorder with radiculopathy, unspecified cervical region    Chronic obstructive pulmonary disease (COPD) (HCC)    COPD (chronic obstructive pulmonary disease) (HCC)    Mixed hyperlipidemia     Family History: Family History  Problem Relation Age of Onset   Alzheimer's disease Father    Lung cancer Brother    Emphysema Brother    Heart disease Neg Hx     Social History   Socioeconomic History   Marital status: Married    Spouse name: Not on file   Number of children: Not on file   Years of education: Not on file   Highest education level: Not on file  Occupational History   Not on file  Tobacco Use   Smoking status: Former    Current packs/day: 0.00    Average packs/day: 2.0 packs/day for 22.0 years (44.0 ttl pk-yrs)    Types: Cigarettes    Start date: 40    Quit date: 2009    Years since quitting: 17.1   Smokeless tobacco: Never  Vaping Use   Vaping status: Never Used  Substance and Sexual Activity   Alcohol use: No   Drug use: No   Sexual activity: Not on file  Other Topics Concern   Not on file  Social History Narrative   Not on file   Social Drivers of Health   Tobacco Use: Medium Risk (02/19/2024)   Patient History    Smoking Tobacco Use: Former    Smokeless Tobacco Use: Never    Passive Exposure: Not on Actuary Strain: Not on file  Food Insecurity: Not on file  Transportation Needs: Not on file  Physical Activity: Not on file  Stress: Not on file  Social Connections: Not on file  Intimate Partner Violence: Not on file  Depression (PHQ2-9): Low Risk (02/19/2024)   Depression (PHQ2-9)    PHQ-2 Score: 0  Alcohol Screen: Low Risk (06/25/2021)   Alcohol Screen    Last Alcohol Screening Score (AUDIT): 0  Housing: Not on file  Utilities: Not on  file  Health Literacy: Not on file      Review of Systems  Constitutional:  Positive for chills and fatigue. Negative for unexpected weight change.  HENT:  Positive for congestion and postnasal drip. Negative for rhinorrhea, sneezing and sore throat.   Eyes:  Negative for redness.  Respiratory:  Positive for cough, shortness of breath and wheezing. Negative for chest tightness.   Cardiovascular:  Negative for chest pain and palpitations.  Gastrointestinal:  Negative for abdominal pain, constipation, diarrhea, nausea and vomiting.  Genitourinary:  Negative for dysuria and frequency.  Musculoskeletal:  Negative for arthralgias, back pain, joint swelling and neck pain.  Skin:  Negative for rash.  Neurological: Negative.  Negative for tremors and numbness.  Hematological:  Negative for adenopathy. Does not bruise/bleed easily.  Psychiatric/Behavioral:  Negative for behavioral problems (Depression), sleep disturbance and suicidal ideas. The patient is not nervous/anxious.     Vital Signs: BP 123/70   Pulse 78   Temp 98 F (36.7 C)   Resp 16   Ht 6' (1.829 m)   Wt 194 lb (88 kg)   SpO2 92%   BMI 26.31 kg/m    Physical Exam Vitals and nursing note reviewed.  Constitutional:      Appearance: Normal appearance. He is ill-appearing.  HENT:     Head: Normocephalic and atraumatic.     Nose: Congestion present.  Eyes:     Extraocular Movements: Extraocular movements intact.  Cardiovascular:     Rate and Rhythm: Normal rate and regular rhythm.  Pulmonary:     Effort: Pulmonary effort is normal.     Breath sounds: Normal breath sounds. No wheezing or rhonchi.  Skin:    General: Skin is warm and dry.  Neurological:     Mental Status: He is alert and oriented to person, place, and time.  Psychiatric:        Mood and Affect: Mood normal.        Behavior: Behavior normal.        Assessment/Plan: 1. Encounter for Medicare annual examination with abnormal findings  (Primary) AWV performed, labs ordered, due for colonoscopy  2. COPD with acute exacerbation (HCC) Will treat with zpak and prednisone . Continue nebs prn - azithromycin  (ZITHROMAX ) 250 MG tablet; Take one tab a day for 10 days for uri  Dispense: 10 tablet; Refill: 0 - predniSONE  (DELTASONE ) 10 MG tablet; Take one tab 3 x day for 3 days, then take one tab 2 x a day for 3 days and then take one tab a day for 3 days for copd  Dispense: 18 tablet; Refill: 0 - albuterol  (PROVENTIL ) (2.5 MG/3ML) 0.083% nebulizer solution; USE 1 VIAL VIA NEBULIZER EVERY 6 HOURS AS NEEDED FOR WHEEZING OR SHORTNESS OF BREATH  Dispense: 75 mL; Refill: 3  3. Screening for colorectal cancer - Ambulatory referral to Gastroenterology  4. Mixed hyperlipidemia Continue atorvastatin  and will update labs - Lipid Panel With LDL/HDL Ratio  5. Thyroid disorder screening - TSH + free T4  6. Screening for prostate cancer - PSA Total (Reflex To Free)  7. Vitamin D  deficiency - VITAMIN D  25 Hydroxy (Vit-D Deficiency, Fractures)  8. Elevated fasting glucose Will check A1c due to previous abnormal fasting glucose readings - Comprehensive metabolic panel with GFR - Hgb J8R w/o eAG  9. Other fatigue - Ambulatory referral to Gastroenterology - CBC w/Diff/Platelet - Comprehensive metabolic panel with GFR - TSH + free T4 - PSA Total (Reflex To Free) - Lipid Panel With LDL/HDL Ratio - VITAMIN D  25 Hydroxy (Vit-D Deficiency, Fractures) - Hgb A1C w/o eAG  10. Dysuria - UA/M w/rflx Culture, Routine     General Counseling: Davaun verbalizes understanding of the findings of todays visit and agrees with plan of treatment. I have discussed any further diagnostic  evaluation that may be needed or ordered today. We also reviewed his medications today. he has been encouraged to call the office with any questions or concerns that should arise related to todays visit.    Orders Placed This Encounter  Procedures   UA/M w/rflx  Culture, Routine   CBC w/Diff/Platelet   Comprehensive metabolic panel with GFR   TSH + free T4   PSA Total (Reflex To Free)   Lipid Panel With LDL/HDL Ratio   VITAMIN D  25 Hydroxy (Vit-D Deficiency, Fractures)   Hgb A1C w/o eAG   Ambulatory referral to Gastroenterology    Meds ordered this encounter  Medications   azithromycin  (ZITHROMAX ) 250 MG tablet    Sig: Take one tab a day for 10 days for uri    Dispense:  10 tablet    Refill:  0   predniSONE  (DELTASONE ) 10 MG tablet    Sig: Take one tab 3 x day for 3 days, then take one tab 2 x a day for 3 days and then take one tab a day for 3 days for copd    Dispense:  18 tablet    Refill:  0   albuterol  (PROVENTIL ) (2.5 MG/3ML) 0.083% nebulizer solution    Sig: USE 1 VIAL VIA NEBULIZER EVERY 6 HOURS AS NEEDED FOR WHEEZING OR SHORTNESS OF BREATH    Dispense:  75 mL    Refill:  3    Return in about 6 months (around 08/18/2024) for general follow up.   Total time spent:35 Minutes Time spent includes review of chart, medications, test results, and follow up plan with the patient.   [place] Controlled Substance Database was reviewed by me.  This patient was seen by Tinnie Pro, PA-C in collaboration with Dr. Sigrid Bathe as a part of collaborative care agreement.  Tinnie Pro, PA-C Internal medicine

## 2024-02-20 ENCOUNTER — Encounter: Payer: Self-pay | Admitting: Physician Assistant

## 2024-02-20 LAB — UA/M W/RFLX CULTURE, ROUTINE
Bilirubin, UA: NEGATIVE
Glucose, UA: NEGATIVE
Ketones, UA: NEGATIVE
Nitrite, UA: NEGATIVE
Protein,UA: NEGATIVE
RBC, UA: NEGATIVE
Specific Gravity, UA: 1.024 (ref 1.005–1.030)
Urobilinogen, Ur: 0.2 mg/dL (ref 0.2–1.0)
pH, UA: 7 (ref 5.0–7.5)

## 2024-02-20 LAB — MICROSCOPIC EXAMINATION
Bacteria, UA: NONE SEEN
Casts: NONE SEEN /LPF
Epithelial Cells (non renal): NONE SEEN /HPF (ref 0–10)
RBC, Urine: NONE SEEN /HPF (ref 0–2)
WBC, UA: NONE SEEN /HPF (ref 0–5)

## 2024-02-20 LAB — URINE CULTURE, REFLEX

## 2024-05-26 ENCOUNTER — Encounter: Admitting: Internal Medicine

## 2024-08-19 ENCOUNTER — Ambulatory Visit: Admitting: Physician Assistant

## 2025-02-21 ENCOUNTER — Ambulatory Visit: Admitting: Physician Assistant
